# Patient Record
Sex: Male | Born: 1975 | Hispanic: Yes | Marital: Single | Smoking: Never smoker
Health system: Southern US, Community
[De-identification: ages and names within clinical notes are randomized; demographics above are authoritative.]

## PROBLEM LIST (undated history)

## (undated) DIAGNOSIS — Z789 Other specified health status: Secondary | ICD-10-CM

## (undated) DIAGNOSIS — I639 Cerebral infarction, unspecified: Secondary | ICD-10-CM

## (undated) HISTORY — PX: NO PAST SURGERIES: SHX2092

---

## 2015-01-31 ENCOUNTER — Emergency Department: Payer: Self-pay | Admitting: Emergency Medicine

## 2015-09-02 ENCOUNTER — Emergency Department: Payer: Self-pay

## 2015-09-02 ENCOUNTER — Inpatient Hospital Stay
Admission: EM | Admit: 2015-09-02 | Discharge: 2015-09-04 | DRG: 064 | Disposition: A | Discharge status: Home / Self Care | Payer: Self-pay | Attending: Internal Medicine | Admitting: Internal Medicine

## 2015-09-02 ENCOUNTER — Encounter: Payer: Self-pay | Admitting: Urgent Care

## 2015-09-02 DIAGNOSIS — I639 Cerebral infarction, unspecified: Principal | ICD-10-CM | POA: Diagnosis present

## 2015-09-02 DIAGNOSIS — K219 Gastro-esophageal reflux disease without esophagitis: Secondary | ICD-10-CM | POA: Diagnosis present

## 2015-09-02 DIAGNOSIS — Z79899 Other long term (current) drug therapy: Secondary | ICD-10-CM

## 2015-09-02 DIAGNOSIS — Z7982 Long term (current) use of aspirin: Secondary | ICD-10-CM

## 2015-09-02 DIAGNOSIS — B690 Cysticercosis of central nervous system: Secondary | ICD-10-CM | POA: Diagnosis present

## 2015-09-02 DIAGNOSIS — F410 Panic disorder [episodic paroxysmal anxiety] without agoraphobia: Secondary | ICD-10-CM | POA: Diagnosis present

## 2015-09-02 DIAGNOSIS — F329 Major depressive disorder, single episode, unspecified: Secondary | ICD-10-CM | POA: Diagnosis present

## 2015-09-02 DIAGNOSIS — I7774 Dissection of vertebral artery: Secondary | ICD-10-CM | POA: Diagnosis present

## 2015-09-02 DIAGNOSIS — F411 Generalized anxiety disorder: Secondary | ICD-10-CM | POA: Diagnosis present

## 2015-09-02 DIAGNOSIS — G4700 Insomnia, unspecified: Secondary | ICD-10-CM | POA: Diagnosis present

## 2015-09-02 DIAGNOSIS — E785 Hyperlipidemia, unspecified: Secondary | ICD-10-CM | POA: Diagnosis present

## 2015-09-02 HISTORY — DX: Other specified health status: Z78.9

## 2015-09-02 LAB — CBC
HEMATOCRIT: 45.9 % (ref 40.0–52.0)
Hemoglobin: 16 g/dL (ref 13.0–18.0)
MCH: 29 pg (ref 26.0–34.0)
MCHC: 34.7 g/dL (ref 32.0–36.0)
MCV: 83.6 fL (ref 80.0–100.0)
PLATELETS: 218 10*3/uL (ref 150–440)
RBC: 5.49 MIL/uL (ref 4.40–5.90)
RDW: 13.3 % (ref 11.5–14.5)
WBC: 10 10*3/uL (ref 3.8–10.6)

## 2015-09-02 NOTE — ED Notes (Signed)
Patient presents with c/o a non-specific headache for over 6 months; worse over the last 2 weeks. Patient advising that he "feels like something is moving in his head." Patient with increased vertiginous symptoms. Patient reports that is is confused at night when he wakes up - "I wake up scared because I do not know when I am." Patient CAO x 4 at time of triage.

## 2015-09-03 ENCOUNTER — Encounter: Payer: Self-pay | Admitting: Internal Medicine

## 2015-09-03 ENCOUNTER — Inpatient Hospital Stay: Payer: Self-pay

## 2015-09-03 ENCOUNTER — Inpatient Hospital Stay
Admit: 2015-09-03 | Discharge: 2015-09-03 | Disposition: A | Discharge status: Home / Self Care | Payer: Self-pay | Attending: Internal Medicine | Admitting: Internal Medicine

## 2015-09-03 DIAGNOSIS — F410 Panic disorder [episodic paroxysmal anxiety] without agoraphobia: Secondary | ICD-10-CM

## 2015-09-03 DIAGNOSIS — I639 Cerebral infarction, unspecified: Secondary | ICD-10-CM | POA: Diagnosis present

## 2015-09-03 DIAGNOSIS — B690 Cysticercosis of central nervous system: Secondary | ICD-10-CM | POA: Diagnosis present

## 2015-09-03 DIAGNOSIS — I63211 Cerebral infarction due to unspecified occlusion or stenosis of right vertebral arteries: Secondary | ICD-10-CM

## 2015-09-03 LAB — CBC WITH DIFFERENTIAL/PLATELET
Basophils Absolute: 0 10*3/uL (ref 0–0.1)
Basophils Relative: 0 %
Eosinophils Absolute: 0.2 10*3/uL (ref 0–0.7)
Eosinophils Relative: 2 %
HCT: 44.1 % (ref 40.0–52.0)
Hemoglobin: 15.7 g/dL (ref 13.0–18.0)
Lymphocytes Relative: 36 %
Lymphs Abs: 3.2 10*3/uL (ref 1.0–3.6)
MCH: 29.8 pg (ref 26.0–34.0)
MCHC: 35.6 g/dL (ref 32.0–36.0)
MCV: 83.7 fL (ref 80.0–100.0)
Monocytes Absolute: 0.7 10*3/uL (ref 0.2–1.0)
Monocytes Relative: 8 %
Neutro Abs: 4.8 10*3/uL (ref 1.4–6.5)
Neutrophils Relative %: 54 %
Platelets: 190 10*3/uL (ref 150–440)
RBC: 5.26 MIL/uL (ref 4.40–5.90)
RDW: 13.6 % (ref 11.5–14.5)
WBC: 8.9 10*3/uL (ref 3.8–10.6)

## 2015-09-03 LAB — URINALYSIS COMPLETE WITH MICROSCOPIC (ARMC ONLY)
Bacteria, UA: NONE SEEN
Bilirubin Urine: NEGATIVE
Glucose, UA: NEGATIVE mg/dL
HGB URINE DIPSTICK: NEGATIVE
KETONES UR: NEGATIVE mg/dL
Leukocytes, UA: NEGATIVE
NITRITE: NEGATIVE
PH: 7 (ref 5.0–8.0)
PROTEIN: NEGATIVE mg/dL
SPECIFIC GRAVITY, URINE: 1.024 (ref 1.005–1.030)
Squamous Epithelial / LPF: NONE SEEN

## 2015-09-03 LAB — COMPREHENSIVE METABOLIC PANEL
ALT: 31 U/L (ref 17–63)
AST: 25 U/L (ref 15–41)
Albumin: 4 g/dL (ref 3.5–5.0)
Alkaline Phosphatase: 41 U/L (ref 38–126)
Anion gap: 6 (ref 5–15)
BUN: 13 mg/dL (ref 6–20)
CO2: 27 mmol/L (ref 22–32)
Calcium: 8.8 mg/dL — ABNORMAL LOW (ref 8.9–10.3)
Chloride: 103 mmol/L (ref 101–111)
Creatinine, Ser: 0.83 mg/dL (ref 0.61–1.24)
GFR calc Af Amer: >60 mL/min (ref >60)
GFR calc non Af Amer: >60 mL/min (ref >60)
Glucose, Bld: 105 mg/dL — ABNORMAL HIGH (ref 65–99)
Potassium: 3.4 mmol/L — ABNORMAL LOW (ref 3.5–5.1)
Sodium: 136 mmol/L (ref 135–145)
Total Bilirubin: 0.5 mg/dL (ref 0.3–1.2)
Total Protein: 7 g/dL (ref 6.5–8.1)

## 2015-09-03 LAB — BASIC METABOLIC PANEL
Anion gap: 6 (ref 5–15)
BUN: 12 mg/dL (ref 6–20)
CHLORIDE: 103 mmol/L (ref 101–111)
CO2: 29 mmol/L (ref 22–32)
Calcium: 9.1 mg/dL (ref 8.9–10.3)
Creatinine, Ser: 0.88 mg/dL (ref 0.61–1.24)
GFR calc non Af Amer: >60 mL/min (ref >60)
Glucose, Bld: 97 mg/dL (ref 65–99)
POTASSIUM: 3.4 mmol/L — AB (ref 3.5–5.1)
SODIUM: 138 mmol/L (ref 135–145)

## 2015-09-03 LAB — HEMOGLOBIN A1C: Hgb A1c MFr Bld: 5.5 % (ref 4.0–6.0)

## 2015-09-03 LAB — LIPID PANEL
Cholesterol: 195 mg/dL (ref 0–200)
HDL: 23 mg/dL — ABNORMAL LOW (ref >40)
LDL Cholesterol: 117 mg/dL — ABNORMAL HIGH (ref 0–99)
Total CHOL/HDL Ratio: 8.5 RATIO
Triglycerides: 277 mg/dL — ABNORMAL HIGH (ref <150)
VLDL: 55 mg/dL — ABNORMAL HIGH (ref 0–40)

## 2015-09-03 LAB — TROPONIN I: Troponin I: <0.03 ng/mL (ref <0.031)

## 2015-09-03 MED ORDER — ACETAMINOPHEN 650 MG RE SUPP: 650.0000 mg | Freq: Four times a day (QID) | RECTAL | Status: DC | PRN

## 2015-09-03 MED ORDER — ENOXAPARIN SODIUM 40 MG/0.4ML ~~LOC~~ SOLN
40.0000 mg | Freq: Every day | SUBCUTANEOUS | Status: DC
  Administered 2015-09-03 – 2015-09-04 (×2): 40 mg via SUBCUTANEOUS
  Filled 2015-09-03 (×2): qty 0.4

## 2015-09-03 MED ORDER — GADOBENATE DIMEGLUMINE 529 MG/ML IV SOLN
15.0000 mL | Freq: Once | INTRAVENOUS | Status: AC | PRN
  Administered 2015-09-03: 12:00:00 15 mL via INTRAVENOUS

## 2015-09-03 MED ORDER — DEXAMETHASONE 4 MG PO TABS
4.0000 mg | ORAL_TABLET | Freq: Two times a day (BID) | ORAL | Status: DC
Start: 2015-09-03 — End: 2015-09-04
  Administered 2015-09-03 – 2015-09-04 (×3): 4 mg via ORAL
  Filled 2015-09-03 (×3): qty 1

## 2015-09-03 MED ORDER — PANTOPRAZOLE SODIUM 40 MG PO TBEC
40.0000 mg | DELAYED_RELEASE_TABLET | Freq: Every day | ORAL | Status: DC
  Administered 2015-09-03 – 2015-09-04 (×2): 40 mg via ORAL
  Filled 2015-09-03 (×2): qty 1

## 2015-09-03 MED ORDER — STROKE: EARLY STAGES OF RECOVERY BOOK
Freq: Once | Status: AC
  Administered 2015-09-03: 03:00:00

## 2015-09-03 MED ORDER — ONDANSETRON HCL 4 MG/2ML IJ SOLN: 4.0000 mg | Freq: Four times a day (QID) | INTRAMUSCULAR | Status: DC | PRN

## 2015-09-03 MED ORDER — ONDANSETRON HCL 4 MG PO TABS: 4.0000 mg | ORAL_TABLET | Freq: Four times a day (QID) | ORAL | Status: DC | PRN

## 2015-09-03 MED ORDER — ACETAMINOPHEN 325 MG PO TABS: 650.0000 mg | ORAL_TABLET | Freq: Four times a day (QID) | ORAL | Status: DC | PRN

## 2015-09-03 MED ORDER — ASPIRIN EC 325 MG PO TBEC
325.0000 mg | DELAYED_RELEASE_TABLET | Freq: Every day | ORAL | Status: DC
  Administered 2015-09-03 – 2015-09-04 (×2): 325 mg via ORAL
  Filled 2015-09-03 (×2): qty 1

## 2015-09-03 MED ORDER — LEVETIRACETAM 500 MG PO TABS
500.0000 mg | ORAL_TABLET | Freq: Two times a day (BID) | ORAL | Status: DC
  Administered 2015-09-03 – 2015-09-04 (×4): 500 mg via ORAL
  Filled 2015-09-03 (×4): qty 1

## 2015-09-03 MED ORDER — ATORVASTATIN CALCIUM 20 MG PO TABS
40.0000 mg | ORAL_TABLET | Freq: Every day | ORAL | Status: DC
  Administered 2015-09-03: 18:00:00 40 mg via ORAL
  Filled 2015-09-03: qty 2

## 2015-09-03 MED ORDER — DOCUSATE SODIUM 100 MG PO CAPS
100.0000 mg | ORAL_CAPSULE | Freq: Two times a day (BID) | ORAL | Status: DC
  Administered 2015-09-03 – 2015-09-04 (×3): 100 mg via ORAL
  Filled 2015-09-03 (×3): qty 1

## 2015-09-03 MED ORDER — SODIUM CHLORIDE 0.9 % IJ SOLN
3.0000 mL | Freq: Two times a day (BID) | INTRAMUSCULAR | Status: DC
  Administered 2015-09-03 – 2015-09-04 (×4): 3 mL via INTRAVENOUS

## 2015-09-03 MED ORDER — ALBENDAZOLE 200 MG PO TABS
400.0000 mg | ORAL_TABLET | Freq: Two times a day (BID) | ORAL | Status: DC
  Administered 2015-09-03 – 2015-09-04 (×3): 400 mg via ORAL
  Filled 2015-09-03 (×5): qty 2

## 2015-09-03 MED ORDER — MIRTAZAPINE 15 MG PO TABS
15.0000 mg | ORAL_TABLET | Freq: Every day | ORAL | Status: DC
  Administered 2015-09-03: 15 mg via ORAL
  Filled 2015-09-03: qty 1

## 2015-09-03 MED ORDER — HYDROCODONE-ACETAMINOPHEN 5-325 MG PO TABS
1.0000 | ORAL_TABLET | ORAL | Status: DC | PRN
Start: 2015-09-03 — End: 2015-09-04

## 2015-09-03 NOTE — Progress Notes (Signed)
Notified Dr. 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

## 2015-09-03 NOTE — Consult Note (Signed)
CC: headache  HPI: Bill Brown is an 39 y.o. male who presents with complaint of persistent dizziness with some vague intermittent mental status changes. Patient states his headache and minor symptoms having going on for some time, but have gotten acutely worse the last 2 weeks. 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   Pt is from [place], came to [place] about 20 yrs ago. Pt has possibility of vertebral dissection.    Past Medical History  Diagnosis Date  . Patient denies medical problems     Past Surgical History  Procedure Laterality Date  . No past surgeries      Family History  Problem Relation Age of Onset  . Family history unknown: Yes    Social History:  reports that he has never smoked. He does not have any smokeless tobacco history on file. He reports that he does not drink alcohol or use illicit drugs.  No Known Allergies  Medications: I have reviewed the patient's current medications.  ROS: History obtained from the patient  General ROS: negative for - chills, fatigue, fever, night sweats, weight gain or weight loss Psychological ROS: negative for - anxiety and depression  Ophthalmic ROS: negative for - blurry vision, double vision, eye pain or loss of vision ENT ROS: negative for - epistaxis, nasal discharge, oral lesions, sore throat, tinnitus or vertigo Allergy and Immunology ROS: negative for - hives or itchy/watery eyes Hematological and Lymphatic ROS: negative for - bleeding problems, bruising or swollen lymph nodes Endocrine ROS: negative for - galactorrhea, hair pattern changes, polydipsia/polyuria or temperature intolerance Respiratory ROS: negative for - cough, hemoptysis, shortness of breath or wheezing Cardiovascular ROS: negative for - chest pain, dyspnea on exertion, edema or irregular heartbeat Gastrointestinal ROS: negative for - abdominal pain, diarrhea, hematemesis, nausea/vomiting or stool  incontinence Genito-Urinary ROS: negative for - dysuria, hematuria, incontinence or urinary frequency/urgency Musculoskeletal ROS: negative for - joint swelling or muscular weakness Neurological ROS: as noted in HPI Dermatological ROS: negative for rash and skin lesion changes  Physical Examination: Blood pressure 111/70, pulse 48, temperature 97.8 F (36.6 C), temperature source Oral, resp. rate 18, height 5\' 3"  (1.6 m), weight 72.576 kg (160 lb), SpO2 99 %.   Neurological Examination Mental Status: Alert, oriented, thought content appropriate.  Speech fluent without evidence of aphasia.  Able to follow 3 step commands without difficulty. Cranial Nerves: II: Discs flat bilaterally; Visual fields grossly normal, pupils equal, round, reactive to light and accommodation III,IV, VI: ptosis not present, extra-ocular motions intact bilaterally V,VII: smile symmetric, facial light touch sensation normal bilaterally VIII: hearing normal bilaterally IX,X: gag reflex present XI: bilateral shoulder shrug XII: midline tongue extension Motor: Right : Upper extremity   5/5    Left:     Upper extremity   5/5  Lower extremity   5/5     Lower extremity   5/5 Tone and bulk:normal tone throughout; no atrophy noted Sensory: Pinprick and light touch intact throughout, bilaterally Deep Tendon Reflexes: 2+ and symmetric throughout Plantars: Right: downgoing   Left: downgoing Cerebellar: normal finger-to-nose, normal rapid alternating movements and normal heel-to-shin test Gait: normal gait and station      Laboratory Studies:   Basic Metabolic Panel:  Recent Labs Lab 09/02/15 2226 09/03/15 0611  NA 138 136  K 3.4* 3.4*  CL 103 103  CO2 29 27  GLUCOSE 97 105*  BUN 12 13  CREATININE 0.88 0.83  CALCIUM 9.1 8.8*  Liver Function Tests:  Recent Labs Lab 09/03/15 0611  AST 25  ALT 31  ALKPHOS 41  BILITOT 0.5  PROT 7.0  ALBUMIN 4.0   No results for input(s): LIPASE, AMYLASE in  the last 168 hours. No results for input(s): AMMONIA in the last 168 hours.  CBC:  Recent Labs Lab 09/02/15 2226 09/03/15 0611  WBC 10.0 8.9  NEUTROABS  --  4.8  HGB 16.0 15.7  HCT 45.9 44.1  MCV 83.6 83.7  PLT 218 190    Cardiac Enzymes:  Recent Labs Lab 09/02/15 2226  TROPONINI <0.03    BNP: Invalid input(s): POCBNP  CBG: No results for input(s): GLUCAP in the last 168 hours.  Microbiology: No results found for this or any previous visit.  Coagulation Studies: No results for input(s): LABPROT, INR in the last 72 hours.  Urinalysis:  Recent Labs Lab 09/03/15 0004  COLORURINE YELLOW*  LABSPEC 1.024  PHURINE 7.0  GLUCOSEU NEGATIVE  HGBUR NEGATIVE  BILIRUBINUR NEGATIVE  KETONESUR NEGATIVE  PROTEINUR NEGATIVE  NITRITE NEGATIVE  LEUKOCYTESUR NEGATIVE    Lipid Panel:     Component Value Date/Time   CHOL 195 09/03/2015 0611   TRIG 277* 09/03/2015 0611   HDL 23* 09/03/2015 0611   CHOLHDL 8.5 09/03/2015 0611   VLDL 55* 09/03/2015 0611   LDLCALC 117* 09/03/2015 0611    HgbA1C: No results found for: HGBA1C  Urine Drug Screen:  No results found for: LABOPIA, COCAINSCRNUR, LABBENZ, AMPHETMU, THCU, LABBARB  Alcohol Level: No results for input(s): ETH in the last 168 hours.  Other results: EKG: normal EKG, normal sinus rhythm, unchanged from previous tracings.  Imaging: Ct Head Wo Contrast  09/02/2015  CLINICAL DATA:  39 year old male acute dizziness and altered mental status. Symptoms for 6 months but greater in the last 2 weeks. Initial encounter. EXAM: CT HEAD WITHOUT CONTRAST TECHNIQUE: Contiguous axial images were obtained from the base of the skull through the vertex without intravenous contrast. COMPARISON:  None. FINDINGS: Visualized paranasal sinuses and mastoids are clear. Negative visualized tympanic cavities. Visualized orbit soft tissues are within normal limits. Visualized scalp soft tissues are within normal limits. No acute osseous  abnormality identified. Cerebral volume is within normal limits. There are several round 4-5 mm calcified foci scattered in the brain. No associated edema or mass effect. These appear primarily subarachnoid in location. That at the cisterna magna on series 2, image 5 might be related to the distal vertebral artery, uncertain. There is confluent peripheral hypodensity in the right cerebellum most pronounced on series 2, image 12. No associated hemorrhage or posterior fossa mass effect. Other posterior fossa gray-white matter differentiation is within normal limits. Supratentorial gray-white matter differentiation is normal. No acute intracranial hemorrhage identified. No ventriculomegaly. No other acute cortically based infarct identified. IMPRESSION: 1. 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 No hemorrhage or mass effect. 2. Largely unremarkable noncontrast CT appearance of the brain otherwise; several 4 mm calcified foci are nonspecific but might reflect neurocysticercosis if the patient has a history of travel to or from an endemic area. Electronically Signed   By: Odessa Fleming M.D.   On: 09/02/2015 22:36   Mr Angiogram Neck W Wo Contrast  09/03/2015  CLINICAL DATA:  39 year old male with acute dizziness and altered mental status. Symptoms for 6 months but worse over the past 2 weeks. Abnormal CT. Subsequent encounter. EXAM: MRI HEAD WITHOUT AND WITH CONTRAST AND MRA HEAD WITHOUT CONTRAST AND MRI NECK WITHOUT AND WITH CONTRAST TECHNIQUE: Multiplanar, multiecho  pulse sequences of the brain and surrounding structures were obtained without and with intravenous contrast. Angiographic images of the head were obtained using MRA technique without contrast. Multiplanar, multiecho pulse sequences of the neck and surrounding structures were obtained without and with intravenous contrast. CONTRAST:  15mL MULTIHANCE GADOBENATE DIMEGLUMINE 529 MG/ML IV SOLN COMPARISON:  09/02/2015 head CT.  No comparison brain  MR. FINDINGS: MRI HEAD FINDINGS No acute infarct. Remote posterior inferior cerebellar infarct with encephalomalacia. No intracranial hemorrhage. Calcifications anterior medial right frontal lobe, posterior superior right parietal lobe and right medulla may reflect result of prior infection (neurocysticercosis) without surrounding vasogenic edema to suggest active inflammation. No intracranial enhancing lesion. No hydrocephalus. 2 cm polypoid opacification inferior left maxillary sinus may represent retention cyst. Mild mucosal thickening maxillary sinuses and ethmoid sinus air cells. Small pituitary gland. Cervical medullary junction, orbital structures and pineal region unremarkable. MRA HEAD FINDINGS Small irregular right vertebral artery ends in a posterior inferior cerebellar artery distribution. The full extent of the right posterior inferior cerebellar artery is not visualized. The portion which is visualized appears slightly narrowed and irregular. Large slightly ectatic left vertebral artery with minimal regularity distal aspect. Mild irregularity of the left posterior inferior cerebellar artery. Ectatic basilar artery with mild narrowing mid aspect without high-grade stenosis. Nonvisualized left anterior inferior cerebellar artery with small appearing right anterior inferior cerebellar artery. Distal aspect of the left superior cerebellar artery is not visualized. Slight irregularity of the right superior cerebellar artery. Slight irregularity distal branches posterior cerebral arteries more notable on the left. Anterior circulation without medium or large size vessel significant stenosis or occlusion. Mild narrowing at the junction of the A1/ A2 segment of the right anterior cerebral artery. Middle cerebral artery branch vessel narrowing and irregularity bilaterally. Minimal bulge undersurface M1 segment right middle cerebral artery may represent origin of a vessel rather than aneurysm. MRI NECK FINDINGS  Three vessel aortic arch. No significant stenosis of either carotid artery. Ectatic distal vertical segment of the right internal carotid artery. No significant stenosis of either subclavian artery or the left vertebral artery. Small irregular right vertebral artery with multiple areas of focal loss of signal. It may be that the right vertebral artery is congenitally small and evaluation limited by the technique however, given the patient's history, right vertebral right dissection is a possibility. Atherosclerotic type changes superimposed upon congenitally small right vertebral artery is a secondary consideration. IMPRESSION: No acute infarct. Remote posterior inferior cerebellar infarct with encephalomalacia. Small irregular right vertebral artery with multiple areas of focal loss of signal. It may be that the right vertebral artery is congenitally small and evaluation limited by the technique however, given the patient's history (right cerebellar infarct), right vertebral right dissection is a distinct possibility. Atherosclerotic type changes superimposed upon congenitally small right vertebral artery is a secondary less likely consideration. Right vertebral artery ends in a posterior inferior cerebellar artery distribution. Intracranial branch vessel irregularity as noted above. Calcifications anterior medial right frontal lobe, posterior superior right parietal lobe and right medulla may reflect result of prior infection (neurocysticercosis) without surrounding vasogenic edema to suggest active inflammation. 2 cm polypoid opacification inferior left maxillary sinus may represent retention cyst. Mild mucosal thickening maxillary sinuses and ethmoid sinus air cells. These results will be called to the ordering clinician or representative by the Radiologist Assistant, and communication documented in the PACS or zVision Dashboard. Electronically Signed   By: Lacy Duverney M.D.   On: 09/03/2015 12:12   Mr Laqueta Jean  Wo Contrast  09/03/2015  CLINICAL DATA:  39 year old male with acute dizziness and altered mental status. Symptoms for 6 months but worse over the past 2 weeks. Abnormal CT. Subsequent encounter. EXAM: MRI HEAD WITHOUT AND WITH CONTRAST AND MRA HEAD WITHOUT CONTRAST AND MRI NECK WITHOUT AND WITH CONTRAST TECHNIQUE: Multiplanar, multiecho pulse sequences of the brain and surrounding structures were obtained without and with intravenous contrast. Angiographic images of the head were obtained using MRA technique without contrast. Multiplanar, multiecho pulse sequences of the neck and surrounding structures were obtained without and with intravenous contrast. CONTRAST:  15mL MULTIHANCE GADOBENATE DIMEGLUMINE 529 MG/ML IV SOLN COMPARISON:  09/02/2015 head CT.  No comparison brain MR. FINDINGS: MRI HEAD FINDINGS No acute infarct. Remote posterior inferior cerebellar infarct with encephalomalacia. No intracranial hemorrhage. Calcifications anterior medial right frontal lobe, posterior superior right parietal lobe and right medulla may reflect result of prior infection (neurocysticercosis) without surrounding vasogenic edema to suggest active inflammation. No intracranial enhancing lesion. No hydrocephalus. 2 cm polypoid opacification inferior left maxillary sinus may represent retention cyst. Mild mucosal thickening maxillary sinuses and ethmoid sinus air cells. Small pituitary gland. Cervical medullary junction, orbital structures and pineal region unremarkable. MRA HEAD FINDINGS Small irregular right vertebral artery ends in a posterior inferior cerebellar artery distribution. The full extent of the right posterior inferior cerebellar artery is not visualized. The portion which is visualized appears slightly narrowed and irregular. Large slightly ectatic left vertebral artery with minimal regularity distal aspect. Mild irregularity of the left posterior inferior cerebellar artery. Ectatic basilar artery with mild  narrowing mid aspect without high-grade stenosis. Nonvisualized left anterior inferior cerebellar artery with small appearing right anterior inferior cerebellar artery. Distal aspect of the left superior cerebellar artery is not visualized. Slight irregularity of the right superior cerebellar artery. Slight irregularity distal branches posterior cerebral arteries more notable on the left. Anterior circulation without medium or large size vessel significant stenosis or occlusion. Mild narrowing at the junction of the A1/ A2 segment of the right anterior cerebral artery. Middle cerebral artery branch vessel narrowing and irregularity bilaterally. Minimal bulge undersurface M1 segment right middle cerebral artery may represent origin of a vessel rather than aneurysm. MRI NECK FINDINGS Three vessel aortic arch. No significant stenosis of either carotid artery. Ectatic distal vertical segment of the right internal carotid artery. No significant stenosis of either subclavian artery or the left vertebral artery. Small irregular right vertebral artery with multiple areas of focal loss of signal. It may be that the right vertebral artery is congenitally small and evaluation limited by the technique however, given the patient's history, right vertebral right dissection is a possibility. Atherosclerotic type changes superimposed upon congenitally small right vertebral artery is a secondary consideration. IMPRESSION: No acute infarct. Remote posterior inferior cerebellar infarct with encephalomalacia. Small irregular right vertebral artery with multiple areas of focal loss of signal. It may be that the right vertebral artery is congenitally small and evaluation limited by the technique however, given the patient's history (right cerebellar infarct), right vertebral right dissection is a distinct possibility. Atherosclerotic type changes superimposed upon congenitally small right vertebral artery is a secondary less likely  consideration. Right vertebral artery ends in a posterior inferior cerebellar artery distribution. Intracranial branch vessel irregularity as noted above. Calcifications anterior medial right frontal lobe, posterior superior right parietal lobe and right medulla may reflect result of prior infection (neurocysticercosis) without surrounding vasogenic edema to suggest active inflammation. 2 cm polypoid opacification inferior left maxillary sinus may represent retention cyst.  Mild mucosal thickening maxillary sinuses and ethmoid sinus air cells. These results will be called to the ordering clinician or representative by the Radiologist Assistant, and communication documented in the PACS or zVision Dashboard. Electronically Signed   By: Lacy Duverney M.D.   On: 09/03/2015 12:12   Mr Palma Holter  09/03/2015  CLINICAL DATA:  39 year old male with acute dizziness and altered mental status. Symptoms for 6 months but worse over the past 2 weeks. Abnormal CT. Subsequent encounter. EXAM: MRI HEAD WITHOUT AND WITH CONTRAST AND MRA HEAD WITHOUT CONTRAST AND MRI NECK WITHOUT AND WITH CONTRAST TECHNIQUE: Multiplanar, multiecho pulse sequences of the brain and surrounding structures were obtained without and with intravenous contrast. Angiographic images of the head were obtained using MRA technique without contrast. Multiplanar, multiecho pulse sequences of the neck and surrounding structures were obtained without and with intravenous contrast. CONTRAST:  15mL MULTIHANCE GADOBENATE DIMEGLUMINE 529 MG/ML IV SOLN COMPARISON:  09/02/2015 head CT.  No comparison brain MR. FINDINGS: MRI HEAD FINDINGS No acute infarct. Remote posterior inferior cerebellar infarct with encephalomalacia. No intracranial hemorrhage. Calcifications anterior medial right frontal lobe, posterior superior right parietal lobe and right medulla may reflect result of prior infection (neurocysticercosis) without surrounding vasogenic edema to suggest active  inflammation. No intracranial enhancing lesion. No hydrocephalus. 2 cm polypoid opacification inferior left maxillary sinus may represent retention cyst. Mild mucosal thickening maxillary sinuses and ethmoid sinus air cells. Small pituitary gland. Cervical medullary junction, orbital structures and pineal region unremarkable. MRA HEAD FINDINGS Small irregular right vertebral artery ends in a posterior inferior cerebellar artery distribution. The full extent of the right posterior inferior cerebellar artery is not visualized. The portion which is visualized appears slightly narrowed and irregular. Large slightly ectatic left vertebral artery with minimal regularity distal aspect. Mild irregularity of the left posterior inferior cerebellar artery. Ectatic basilar artery with mild narrowing mid aspect without high-grade stenosis. Nonvisualized left anterior inferior cerebellar artery with small appearing right anterior inferior cerebellar artery. Distal aspect of the left superior cerebellar artery is not visualized. Slight irregularity of the right superior cerebellar artery. Slight irregularity distal branches posterior cerebral arteries more notable on the left. Anterior circulation without medium or large size vessel significant stenosis or occlusion. Mild narrowing at the junction of the A1/ A2 segment of the right anterior cerebral artery. Middle cerebral artery branch vessel narrowing and irregularity bilaterally. Minimal bulge undersurface M1 segment right middle cerebral artery may represent origin of a vessel rather than aneurysm. MRI NECK FINDINGS Three vessel aortic arch. No significant stenosis of either carotid artery. Ectatic distal vertical segment of the right internal carotid artery. No significant stenosis of either subclavian artery or the left vertebral artery. Small irregular right vertebral artery with multiple areas of focal loss of signal. It may be that the right vertebral artery is  congenitally small and evaluation limited by the technique however, given the patient's history, right vertebral right dissection is a possibility. Atherosclerotic type changes superimposed upon congenitally small right vertebral artery is a secondary consideration. IMPRESSION: No acute infarct. Remote posterior inferior cerebellar infarct with encephalomalacia. Small irregular right vertebral artery with multiple areas of focal loss of signal. It may be that the right vertebral artery is congenitally small and evaluation limited by the technique however, given the patient's history (right cerebellar infarct), right vertebral right dissection is a distinct possibility. Atherosclerotic type changes superimposed upon congenitally small right vertebral artery is a secondary less likely consideration. Right vertebral artery ends in a posterior inferior  cerebellar artery distribution. Intracranial branch vessel irregularity as noted above. Calcifications anterior medial right frontal lobe, posterior superior right parietal lobe and right medulla may reflect result of prior infection (neurocysticercosis) without surrounding vasogenic edema to suggest active inflammation. 2 cm polypoid opacification inferior left maxillary sinus may represent retention cyst. Mild mucosal thickening maxillary sinuses and ethmoid sinus air cells. These results will be called to the ordering clinician or representative by the Radiologist Assistant, and communication documented in the PACS or zVision Dashboard. Electronically Signed   By: Lacy Duverney M.D.   On: 09/03/2015 12:12     Assessment/Plan:  39 y.o. male who presents with complaint of persistent dizziness with some vague intermittent mental status changes. Patient states his headache and minor symptoms having going on for some time, but have gotten acutely worse the last 2 weeks. 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   Pt is  from [place], came to [place] about 20 yrs ago. Pt has possibility of vertebral dissection.    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 No need for steroids or anti epileptics as calcifications are not cortical  2. R cerebellar subacute stroke  - Likely in the setting of vertebral dissection. No need for anticoagulation. Full dose of ASA daily 3. Depression/anxieoty  - Psychiatric evaluation which can be done as out pt, but not sure about pt's insurance 4. D/c planning later today/tomorrow.  Pauletta Browns  09/03/2015, 1:09 PM

## 2015-09-03 NOTE — Progress Notes (Signed)
Speech Therapy Note: received order, reviewed chart notes and consulted NSG. Met w/ pt who denied any s/s of aspiration/dysphagia; stated he ate some of his breakfast meal w/out difficulty. NSG denied any s/s of aspiration w/ meds. Pt is on a regular diet. Pt conversed w/ SLP at conversational level w/out any s/s receptive or expressive language deficits; speech was 100% intelligible. Pt does speak [place] and [place].  Of note, he shared w/ me concerns of not sleeping well, s/s of Reflux, as well as Anxiety which he stated he had been "reading on" at home. He described that his "thoughts" keep him from sleeping well and that he awakens "20" times some nights. He described that this has been ongoing since he was ~39 yrs old. He also stated he feels he is under stress at home. Discussed pt's report of his situation w/ his MD who will f/u.  No further skilled ST services indicated at this time. NSG/MD to reconsult if status changes.

## 2015-09-03 NOTE — ED Notes (Signed)
MD at bedside. 

## 2015-09-03 NOTE — Plan of Care (Signed)
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 Uneventful shift.

## 2015-09-03 NOTE — H&P (Addendum)
[facility] - [place] at [facility]   PATIENT NAME: Bill Brown    MR#:  [id number]  DATE OF BIRTH:  August 04, 1976  DATE OF ADMISSION:  09/02/2015  PRIMARY CARE PHYSICIAN: No PCP Per Patient   REQUESTING/REFERRING PHYSICIAN: Manson Passey, M.D.  CHIEF COMPLAINT:   Chief Complaint  Patient presents with  . Headache  . Dizziness  . Altered Mental Status    HISTORY OF PRESENT ILLNESS:  Bill Brown  is a 39 y.o. male who presents with complaint of persistent dizziness with some vague intermittent mental status changes. Patient states his headache and minor symptoms having going on for some time, but have gotten acutely worse the last 2 weeks. Patient states that he has had a lot of stress recently, with tense neck muscles for which she had been stretching his neck, and manipulating his cervical spine. He states that his C-spine would "pop a lot". He feels that he has "something moving down in his head." He came to the ED for evaluation. 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 On further interview the patient does state that he is seeing something moving across his vision, like a "squiggly line". 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  PAST MEDICAL HISTORY:   Past Medical History  Diagnosis Date  . Patient denies medical problems     PAST SURGICAL HISTORY:   Past Surgical History  Procedure Laterality Date  . No past surgeries      SOCIAL HISTORY:   Social History  Substance Use Topics  . Smoking status: Never Smoker   . Smokeless tobacco: Not on file  . Alcohol Use: No    FAMILY HISTORY:   Family History  Problem Relation Age of Onset  . Family history unknown: Yes    DRUG ALLERGIES:  No Known Allergies  MEDICATIONS AT HOME:   Prior to Admission medications   Not on File     REVIEW OF SYSTEMS:  Review of Systems  Constitutional: Negative for fever, chills, weight loss and malaise/fatigue.  HENT: Negative for ear pain, hearing loss and tinnitus.   Eyes: Positive for blurred vision. Negative for double vision, pain and redness.  Respiratory: Negative for cough, hemoptysis and shortness of breath.   Cardiovascular: Negative for chest pain, palpitations, orthopnea and leg swelling.  Gastrointestinal: Negative for nausea, vomiting, abdominal pain, diarrhea and constipation.  Genitourinary: Negative for dysuria, frequency and hematuria.  Musculoskeletal: Negative for back pain, joint pain and neck pain.  Skin:       No acne, rash, or lesions  Neurological: Positive for dizziness. Negative for tremors, focal weakness and weakness.       Intermittent confusion  Endo/Heme/Allergies: Negative for polydipsia. Does not bruise/bleed easily.  Psychiatric/Behavioral: Negative for depression. The patient is not nervous/anxious and does not have insomnia.      VITAL SIGNS:   Filed Vitals:   09/03/15 0015 09/03/15 0030 09/03/15 0045 09/03/15 0100  BP:   120/79 138/103  Pulse: 56 59 64 55  Temp:      TempSrc:      Resp:      Height:      Weight:      SpO2: 100% 97% 100% 100%   Wt Readings from Last 3 Encounters:  09/02/15 67.132 kg (148 lb)    PHYSICAL EXAMINATION:  Physical Exam  Vitals reviewed. Constitutional:  He is oriented to person, place, and time. He appears well-developed and well-nourished. No distress.  HENT:  Head: Normocephalic and atraumatic.  Mouth/Throat: Oropharynx is clear and moist.  Eyes: Conjunctivae and EOM are normal. Pupils are equal, round, and reactive to light. No scleral icterus.  Neck: Normal range of motion. Neck supple. No JVD present. No thyromegaly present.  Cardiovascular: Normal rate, regular rhythm and intact distal pulses.  Exam reveals no gallop and no friction rub.   No murmur heard. Respiratory: Effort normal and  breath sounds normal. No respiratory distress. He has no wheezes. He has no rales.  GI: Soft. Bowel sounds are normal. He exhibits no distension. There is no tenderness.  Musculoskeletal: Normal range of motion. He exhibits no edema.  No arthritis, no gout  Lymphadenopathy:    He has no cervical adenopathy.  Neurological: He is alert and oriented to person, place, and time.  Neurologic: Cranial nerves II-XII intact, Sensation intact to light touch/pinprick, 5/5 strength in all extremities, no dysarthria, no aphasia, no dysphagia, memory intact, no pronator drift, Babinski sign not present.   Skin: Skin is warm and dry. No rash noted. No erythema.  Psychiatric: He has a normal mood and affect. His behavior is normal. Judgment and thought content normal.    LABORATORY PANEL:   CBC  Recent Labs Lab 09/02/15 2226  WBC 10.0  HGB 16.0  HCT 45.9  PLT 218   ------------------------------------------------------------------------------------------------------------------  Chemistries   Recent Labs Lab 09/02/15 2226  NA 138  K 3.4*  CL 103  CO2 29  GLUCOSE 97  BUN 12  CREATININE 0.88  CALCIUM 9.1   ------------------------------------------------------------------------------------------------------------------  Cardiac Enzymes  Recent Labs Lab 09/02/15 2226  TROPONINI <0.03   ------------------------------------------------------------------------------------------------------------------  RADIOLOGY:  Ct Head Wo Contrast  09/02/2015  CLINICAL DATA:  39 year old male acute dizziness and altered mental status. Symptoms for 6 months but greater in the last 2 weeks. Initial encounter. EXAM: CT HEAD WITHOUT CONTRAST TECHNIQUE: Contiguous axial images were obtained from the base of the skull through the vertex without intravenous contrast. COMPARISON:  None. FINDINGS: Visualized paranasal sinuses and mastoids are clear. Negative visualized tympanic cavities. Visualized orbit  soft tissues are within normal limits. Visualized scalp soft tissues are within normal limits. No acute osseous abnormality identified. Cerebral volume is within normal limits. There are several round 4-5 mm calcified foci scattered in the brain. No associated edema or mass effect. These appear primarily subarachnoid in location. That at the cisterna magna on series 2, image 5 might be related to the distal vertebral artery, uncertain. There is confluent peripheral hypodensity in the right cerebellum most pronounced on series 2, image 12. No associated hemorrhage or posterior fossa mass effect. Other posterior fossa gray-white matter differentiation is within normal limits. Supratentorial gray-white matter differentiation is normal. No acute intracranial hemorrhage identified. No ventriculomegaly. No other acute cortically based infarct identified. IMPRESSION: 1. 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 No hemorrhage or mass effect. 2. Largely unremarkable noncontrast CT appearance of the brain otherwise; several 4 mm calcified foci are nonspecific but might reflect neurocysticercosis if the patient has a history of travel to or from an endemic area. Electronically Signed   By: Odessa FlemingH  Hall M.D.   On: 09/02/2015 22:36    EKG:   Orders placed or performed during the hospital encounter of 09/02/15  . ED EKG  . ED EKG    IMPRESSION AND PLAN:  Principal Problem:   Stroke (cerebrum) [facility](HCC) - neurology consult, MRI  and MRA head with MRA neck, echocardiogram, fasting lipids, hemoglobin A1c, troponin negative. 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 His brother also recently was treated [facility] for some "parasite in his brain." MRI will hopefully characterize cystic lesions better. We will hold treatment until we can get an ophthalmology consult for full eye exam. Then use albendazole if neurocysticercosis is still suspected/proven  All the  records are reviewed and case discussed with ED provider. Management plans discussed with the patient and/or family.  DVT PROPHYLAXIS: SubQ lovenox  ADMISSION STATUS: Inpatient  CODE STATUS: Full  TOTAL TIME TAKING CARE OF THIS PATIENT: 50 minutes.    Tahir Blank FIELDING 09/03/2015, 1:31 AM  [facility]  Office  [phone]  CC: Primary care physician; No PCP Per Patient

## 2015-09-03 NOTE — Progress Notes (Addendum)
Patient to MRI with transporter

## 2015-09-03 NOTE — Progress Notes (Signed)
Patient ID: Bill Brown, male   DOB: 05/28/1976, 39 y.o.   MRN: [id number] Reason for Consult:Eval for neurocystericosis Referring Physician: Alysia PennaWillis  Bill Brown is an 39 y.o. male.  Chief complaint: dizziness and headaches  Stroke (cerebrum) (HCC)  HPI: 39 yo no prior ocular history presents with HA's and dizziness.  CT scan susp. for cystericosis.  Pt reports occ floaters - unsure which eye- and blurriness at near.  Wears glasses at work, but none otherwise.  Denies Flashes of light, diplopia, pain in/around eyes, redness, or photosensitivity.  No swelling or redness of lids.  Past Medical History  Diagnosis Date  . Patient denies medical problems     Past Surgical History  Procedure Laterality Date  . No past surgeries      Family History  Problem Relation Age of Onset  . Family history unknown: Yes    Social History:  reports that he has never smoked. He does not have any smokeless tobacco history on file. He reports that he does not drink alcohol or use illicit drugs.  Allergies: No Known Allergies  Medications: I have reviewed the patient's current medications.  Labs/ CT Head/ MRI head/ neck/ MRA -  Results reviewed.  ROS - dizziness, HA's.  Blood pressure 111/70, pulse 48, temperature 97.8 F (36.6 C), temperature source Oral, resp. rate 18, height 5\' 3"  (1.6 m), weight 72.576 kg (160 lb), SpO2 99 %.   Physical Exam  A+O x4 Va 20/40 OD 20/30 OS near [place] EOM full/ortho Pupils - errl.  No Rapd VF - FTC OU Tpalp - nml OU  Ext/ L/L - Nml OU Conj/ Cornea/ AC / IRis/ Lens - nml OU Fundus - dilated with mydriacyl and phenylephrine: Disc sharp, 0.4 c/d OU no pallor or edema Macula/ Vessels - Nml OU Periphery Nml OU - specifically no cystic lesions or retinal inflammatory foci.    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  There is no ophthalmic manifestation of this disease currently.  He has complaints of floaters, but no associated  pathology.  f/u [facility] for any further symptoms.  Bill Brown 09/03/2015, 1:00 PM

## 2015-09-03 NOTE — Progress Notes (Signed)
*  PRELIMINARY RESULTS* Echocardiogram 2D Echocardiogram has been performed.  Bill Brown 09/03/2015, 8:21 AM

## 2015-09-03 NOTE — Progress Notes (Addendum)
Put in a call to pharmacy regarding pt's dose of albendazole (Albenza) as medication was not found in [facility] or in pt's bin.  Pharmacy tech stated that we do not have the medication on site and it is out of stock in the surrounding areas, therefore they sent [facility] to procure 12 doses.  Medication will be sent up when driver returns. Merry ProudBrandi, RN, notified.

## 2015-09-03 NOTE — Consult Note (Signed)
[facility] Face-to-Face Psychiatry Consult   Reason for Consult:  Consult for this 39 year old man with no significant past psychiatric history but who is in the hospital because of a stroke and a possible neurologic infection. Consideration of anxiety symptoms. Referring Physician:  Tressia Miners Patient Identification: Olvin Rohr MRN:  [id number] Principal Diagnosis: Stroke (cerebrum) [facility]) Diagnosis:   Patient Active Problem List   Diagnosis Date Noted  . Stroke (cerebrum) ([place]) [I63.9] 09/03/2015  . 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 Panic attacks [F41.0] 09/03/2015    Total Time spent with patient: 1 hour  Subjective:   Everrett Lacasse is a 39 y.o. male patient admitted with "I just need to know if what I'm feeling his normal".  HPI:  Information from the patient and the chart. Patient was interviewed. Chart reviewed including current notes. Labs reviewed and vital signs reviewed. Ports of imaging reviewed. This patient is reporting that his chief psychiatric complaint has to do with sleep. He says about 5 nights out of the week he will find himself waking up in the middle of the night feeling very nervous. Sometimes talking in his sleep. Frequently finds himself dreaming about his job. He talks about feeling anxious during the day because his job is stressful. He has worked at a [employer] for years but has only recently started doing a new job which apparently he is finding it a little difficult to adapt to. Additionally he has had other stresses in his life including a divorce in 2013 and a daughter who had cancer. Now he himself as this serious medical problem. Patient denies feeling depressed or sad. He also describes however several events which have happened over the last for 5 months which sound to me like possible panic attacks. He describes suddenly feeling like his heart is beating fast and like he is going to pass out. This is a transient thing that is very frightening to him.  No substance abuse reported. No current psychiatric treatment.  Past psychiatric history: He says he saw a counselor during his divorce a few years ago but has never had psychiatric hospitalization. No psychiatric medicine. No suicide attempts.  Social history: Patient is divorced. He has 3 children ages 48 and 16 and 50. He sees them on the weekends when they come to visit him. He works at a [employer] and recently has started a new job which she is finding rather stressful. He has a girlfriend and stays at her house several nights of the week.  Medical history: Patient evidently has had a stroke and now appears to have neurocysticercosis.  Family history: He denies knowing of any family history of mental health problems.  Substance abuse history: He says he drinks beer occasionally but has never found alcohol used to be a problem and denies other substance abuse.  Past Psychiatric History: therapy briefly during his divorce but no psychiatric medicine no psychiatric hospitalizations and no suicide attempts.  Risk to Self: Is patient at risk for suicide?: No Risk to Others:   Prior Inpatient Therapy:   Prior Outpatient Therapy:    Past Medical History:  Past Medical History  Diagnosis Date  . Patient denies medical problems     Past Surgical History  Procedure Laterality Date  . No past surgeries     Family History:  Family History  Problem Relation Age of Onset  . Family history unknown: Yes   Family Psychiatric  History: denies any family history of mental illness Social History:  History  Alcohol  Use No     History  Drug Use No    Social History   Social History  . Marital Status: Single    Spouse Name: N/A  . Number of Children: N/A  . Years of Education: N/A   Social History Main Topics  . Smoking status: Never Smoker   . Smokeless tobacco: None  . Alcohol Use: No  . Drug Use: No  . Sexual Activity: Not Asked   Other Topics Concern  . None   Social  History Narrative   Additional Social History:                          Allergies:  No Known Allergies  Labs:  Results for orders placed or performed during the hospital encounter of 09/02/15 (from the past 48 hour(s))  Basic metabolic panel     Status: Abnormal   Collection Time: 09/02/15 10:26 PM  Result Value Ref Range   Sodium 138 135 - 145 mmol/L   Potassium 3.4 (L) 3.5 - 5.1 mmol/L   Chloride 103 101 - 111 mmol/L   CO2 29 22 - 32 mmol/L   Glucose, Bld 97 65 - 99 mg/dL   BUN 12 6 - 20 mg/dL   Creatinine, Ser 0.88 0.61 - 1.24 mg/dL   Calcium 9.1 8.9 - 10.3 mg/dL   GFR calc non Af Amer >60 >60 mL/min   GFR calc Af Amer >60 >60 mL/min    Comment: (NOTE) The eGFR has been calculated using the CKD EPI equation. This calculation has not been validated in all clinical situations. eGFR's persistently <60 mL/min signify possible Chronic Kidney Disease.    Anion gap 6 5 - 15  CBC     Status: None   Collection Time: 09/02/15 10:26 PM  Result Value Ref Range   WBC 10.0 3.8 - 10.6 K/uL   RBC 5.49 4.40 - 5.90 MIL/uL   Hemoglobin 16.0 13.0 - 18.0 g/dL   HCT 45.9 40.0 - 52.0 %   MCV 83.6 80.0 - 100.0 fL   MCH 29.0 26.0 - 34.0 pg   MCHC 34.7 32.0 - 36.0 g/dL   RDW 13.3 11.5 - 14.5 %   Platelets 218 150 - 440 K/uL  Troponin I     Status: None   Collection Time: 09/02/15 10:26 PM  Result Value Ref Range   Troponin I <0.03 <0.031 ng/mL    Comment:        NO INDICATION OF MYOCARDIAL INJURY.   Urinalysis complete, with microscopic ([facility] only)     Status: Abnormal   Collection Time: 09/03/15 12:04 AM  Result Value Ref Range   Color, Urine YELLOW (A) YELLOW   APPearance CLEAR (A) CLEAR   Glucose, UA NEGATIVE NEGATIVE mg/dL   Bilirubin Urine NEGATIVE NEGATIVE   Ketones, ur NEGATIVE NEGATIVE mg/dL   Specific Gravity, Urine 1.024 1.005 - 1.030   Hgb urine dipstick NEGATIVE NEGATIVE   pH 7.0 5.0 - 8.0   Protein, ur NEGATIVE NEGATIVE mg/dL   Nitrite NEGATIVE NEGATIVE    Leukocytes, UA NEGATIVE NEGATIVE   RBC / HPF 0-5 0 - 5 RBC/hpf   WBC, UA 0-5 0 - 5 WBC/hpf   Bacteria, UA NONE SEEN NONE SEEN   Squamous Epithelial / LPF NONE SEEN NONE SEEN   Mucous PRESENT   Lipid panel     Status: Abnormal   Collection Time: 09/03/15  6:11 AM  Result Value Ref Range  Cholesterol 195 0 - 200 mg/dL   Triglycerides 277 (H) <150 mg/dL   HDL 23 (L) >40 mg/dL   Total CHOL/HDL Ratio 8.5 RATIO   VLDL 55 (H) 0 - 40 mg/dL   LDL Cholesterol 117 (H) 0 - 99 mg/dL    Comment:        Total Cholesterol/HDL:CHD Risk Coronary Heart Disease Risk Table                     Men   Women  1/2 Average Risk   3.4   3.3  Average Risk       5.0   4.4  2 X Average Risk   9.6   7.1  3 X Average Risk  23.4   11.0        Use the calculated Patient Ratio above and the CHD Risk Table to determine the patient's CHD Risk.        ATP III CLASSIFICATION (LDL):  <100     mg/dL   Optimal  100-129  mg/dL   Near or Above                    Optimal  130-159  mg/dL   Borderline  160-189  mg/dL   High  >190     mg/dL   Very High   Hemoglobin A1c     Status: None   Collection Time: 09/03/15  6:11 AM  Result Value Ref Range   Hgb A1c MFr Bld 5.5 4.0 - 6.0 %  Comprehensive metabolic panel     Status: Abnormal   Collection Time: 09/03/15  6:11 AM  Result Value Ref Range   Sodium 136 135 - 145 mmol/L   Potassium 3.4 (L) 3.5 - 5.1 mmol/L   Chloride 103 101 - 111 mmol/L   CO2 27 22 - 32 mmol/L   Glucose, Bld 105 (H) 65 - 99 mg/dL   BUN 13 6 - 20 mg/dL   Creatinine, Ser 0.83 0.61 - 1.24 mg/dL   Calcium 8.8 (L) 8.9 - 10.3 mg/dL   Total Protein 7.0 6.5 - 8.1 g/dL   Albumin 4.0 3.5 - 5.0 g/dL   AST 25 15 - 41 U/L   ALT 31 17 - 63 U/L   Alkaline Phosphatase 41 38 - 126 U/L   Total Bilirubin 0.5 0.3 - 1.2 mg/dL   GFR calc non Af Amer >60 >60 mL/min   GFR calc Af Amer >60 >60 mL/min    Comment: (NOTE) The eGFR has been calculated using the CKD EPI equation. This calculation has not been  validated in all clinical situations. eGFR's persistently <60 mL/min signify possible Chronic Kidney Disease.    Anion gap 6 5 - 15  CBC with Differential/Platelet     Status: None   Collection Time: 09/03/15  6:11 AM  Result Value Ref Range   WBC 8.9 3.8 - 10.6 K/uL   RBC 5.26 4.40 - 5.90 MIL/uL   Hemoglobin 15.7 13.0 - 18.0 g/dL   HCT 44.1 40.0 - 52.0 %   MCV 83.7 80.0 - 100.0 fL   MCH 29.8 26.0 - 34.0 pg   MCHC 35.6 32.0 - 36.0 g/dL   RDW 13.6 11.5 - 14.5 %   Platelets 190 150 - 440 K/uL   Neutrophils Relative % 54 %   Neutro Abs 4.8 1.4 - 6.5 K/uL   Lymphocytes Relative 36 %   Lymphs Abs 3.2 1.0 - 3.6 K/uL  Monocytes Relative 8 %   Monocytes Absolute 0.7 0.2 - 1.0 K/uL   Eosinophils Relative 2 %   Eosinophils Absolute 0.2 0 - 0.7 K/uL   Basophils Relative 0 %   Basophils Absolute 0.0 0 - 0.1 K/uL    Current Facility-Administered Medications  Medication Dose Route Frequency Provider Last Rate Last Dose  . acetaminophen (TYLENOL) tablet 650 mg  650 mg Oral Q6H PRN Lance Coon, MD      . albendazole [facility]) tablet 400 mg  400 mg Oral BID WC Gladstone Lighter, MD   400 mg at 09/03/15 1746  . aspirin EC tablet 325 mg  325 mg Oral Daily Gladstone Lighter, MD   325 mg at 09/03/15 1442  . atorvastatin (LIPITOR) tablet 40 mg  40 mg Oral q1800 Gladstone Lighter, MD   40 mg at 09/03/15 1745  . dexamethasone (DECADRON) tablet 4 mg  4 mg Oral Q12H Gladstone Lighter, MD   4 mg at 09/03/15 1155  . docusate sodium (COLACE) capsule 100 mg  100 mg Oral BID Gladstone Lighter, MD   100 mg at 09/03/15 1745  . enoxaparin (LOVENOX) injection 40 mg  40 mg Subcutaneous Daily Lance Coon, MD   40 mg at 09/03/15 1156  . HYDROcodone-acetaminophen (NORCO/VICODIN) 5-325 MG per tablet 1-2 tablet  1-2 tablet Oral Q4H PRN Gladstone Lighter, MD      . levETIRAcetam (KEPPRA) tablet 500 mg  500 mg Oral BID Lance Coon, MD   500 mg at 09/03/15 1155  . mirtazapine (REMERON) tablet 15 mg  15 mg Oral QHS  Gonzella Lex, MD      . ondansetron [facility]) tablet 4 mg  4 mg Oral Q6H PRN Lance Coon, MD       Or  . ondansetron [facility]) injection 4 mg  4 mg Intravenous Q6H PRN Lance Coon, MD      . pantoprazole (PROTONIX) EC tablet 40 mg  40 mg Oral Daily Gladstone Lighter, MD   40 mg at 09/03/15 1745  . sodium chloride 0.9 % injection 3 mL  3 mL Intravenous Q12H Lance Coon, MD   3 mL at 09/03/15 1156    Musculoskeletal: Strength & Muscle Tone: within normal limits Gait & Station: normal Patient leans: N/A  Psychiatric Specialty Exam: Review of Systems  Constitutional: Negative.   HENT: Negative.   Eyes: Negative.   Respiratory: Negative.   Cardiovascular: Negative.   Gastrointestinal: Negative.   Musculoskeletal: Negative.   Skin: Negative.   Neurological: Negative.   Psychiatric/Behavioral: Negative for depression, suicidal ideas, hallucinations, memory loss and substance abuse. The patient is nervous/anxious and has insomnia.     Blood pressure 112/69, pulse 60, temperature 97.9 F (36.6 C), temperature source Oral, resp. rate 18, height 5' 3"  (1.6 m), weight 72.576 kg (160 lb), SpO2 98 %.Body mass index is 28.35 kg/(m^2).  General Appearance: Fairly Groomed  Engineer, water::  Good  Speech:  Normal Rate  Volume:  Normal  Mood:  Anxious  Affect:  Appropriate  Thought Process:  Goal Directed  Orientation:  Full (Time, Place, and Person)  Thought Content:  Negative  Suicidal Thoughts:  No  Homicidal Thoughts:  No  Memory:  Immediate;   Fair Recent;   Fair Remote;   Good  Judgement:  Good  Insight:  Good  Psychomotor Activity:  Normal  Concentration:  Good  Recall:  Good  Fund of Knowledge:Good  Language: Good  Akathisia:  No  Handed:  Right  AIMS (if indicated):  Assets:  Communication Skills Desire for Improvement Housing Intimacy Resilience  ADL's:  Intact  Cognition: WNL  Sleep:      Treatment Plan Summary: Medication management and Plan this is a  39 year old man with a significant newly discovered neurologic condition. He is complaining of anxiety symptoms. There is no sign of psychosis and no sign of dangerousness. Supportive counseling done with the patient and educated him about anxiety and sleep problems. Pointed out to him the multiple stresses he is going through and how it would not be unusual to have anxiety during this time. Discussed treatments including medicine and therapy. I proposed starting mirtazapine 15 mg at night since he told me several times that his chief concern is with the sleep problem. This can also help treat anxiety symptoms. Patient encouraged to continue talking about this if it remains a problem for him and that if he continues to have anxiety once he gets discharged she can be referred to local providers. Patient is agreeable to the plan.  Disposition: No evidence of imminent risk to self or others at present.   Patient does not meet criteria for psychiatric inpatient admission. Supportive therapy provided about ongoing stressors. Discussed crisis plan, support from social network, calling 911, coming to the Emergency Department, and calling Suicide Hotline.  Kaho Selle 09/03/2015 6:24 PM

## 2015-09-03 NOTE — Care Management (Signed)
Spoke with pharmacist here at this facility. This pharmacy has 3 days with albendazle 400mg  BID. This patient will need 7-10 day of this medication. Two tablets (200mg ) cost $346.00. There are no other pharmacies in [place] that carry this medication. Telephone call to [facility]. When applying for assistant at this pharmacy will need to be at their pharmacy before 4:pm. They do take patients with out insurance  Mr. Bill Brown will need to go to [facility] [address]. Telephone# [phone] Will update Dr. Nemiah CommanderKalisetti.  Bill GreetBrenda S Jadence Kinlaw RN MSN Care Management [phone]

## 2015-09-03 NOTE — Progress Notes (Signed)
[facility] - [place] at [facility]   PATIENT NAME: Bill Brown    MR#:  [id number]  DATE OF BIRTH:  07-09-1976  SUBJECTIVE:  CHIEF COMPLAINT:   Chief Complaint  Patient presents with  . Headache  . Dizziness  . Altered Mental Status   - Patient admitted with significant headache and noted to have acute right cerebellar infarct. 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 - doing well now  REVIEW OF SYSTEMS:  Review of Systems  Constitutional: Negative for fever and chills.  HENT: Negative for congestion, ear discharge and nosebleeds.   Respiratory: Negative for cough, shortness of breath and wheezing.   Cardiovascular: Negative for chest pain, palpitations and leg swelling.  Gastrointestinal: Positive for heartburn. Negative for nausea, vomiting, abdominal pain, diarrhea and constipation.  Genitourinary: Negative for dysuria.  Neurological: Positive for headaches. Negative for dizziness, sensory change, speech change, focal weakness, seizures and weakness.  Psychiatric/Behavioral: Positive for depression. The patient is nervous/anxious.     DRUG ALLERGIES:  No Known Allergies  VITALS:  Blood pressure 112/69, pulse 60, temperature 97.9 F (36.6 C), temperature source Oral, resp. rate 18, height  (1.6 m), weight 72.576 kg (160 lb), SpO2 98 %.  PHYSICAL EXAMINATION:  Physical Exam  GENERAL:  39 y.o.-year-old patient lying in the bed with no acute distress.  EYES: Pupils equal, round, reactive to light and accommodation. No scleral icterus. Extraocular muscles intact.  HEENT: Head atraumatic, normocephalic. Oropharynx and nasopharynx clear.  NECK:  Supple, no jugular venous distention. No thyroid enlargement, no tenderness.  LUNGS: Normal breath sounds bilaterally, no wheezing, rales,rhonchi or crepitation. No use of accessory muscles of respiration.   CARDIOVASCULAR: S1, S2 normal. No murmurs, rubs, or gallops.  ABDOMEN: Soft, nontender, nondistended. Bowel sounds present. No organomegaly or mass.  EXTREMITIES: No pedal edema, cyanosis, or clubbing.  NEUROLOGIC: Cranial nerves II through XII are intact. Muscle strength 5/5 in all extremities. Sensation intact. Gait not checked.  PSYCHIATRIC: The patient is alert and oriented x 3.  SKIN: No obvious rash, lesion, or ulcer.    LABORATORY PANEL:   CBC  Recent Labs Lab 09/03/15 0611  WBC 8.9  HGB 15.7  HCT 44.1  PLT 190   ------------------------------------------------------------------------------------------------------------------  Chemistries   Recent Labs Lab 09/03/15 0611  NA 136  K 3.4*  CL 103  CO2 27  GLUCOSE 105*  BUN 13  CREATININE 0.83  CALCIUM 8.8*  AST 25  ALT 31  ALKPHOS 41  BILITOT 0.5   ------------------------------------------------------------------------------------------------------------------  Cardiac Enzymes  Recent Labs Lab 09/02/15 2226  TROPONINI <0.03   ------------------------------------------------------------------------------------------------------------------  RADIOLOGY:  Ct Head Wo Contrast  09/02/2015  CLINICAL DATA:  39 year old male acute dizziness and altered mental status. Symptoms for 6 months but greater in the last 2 weeks. Initial encounter. EXAM: CT HEAD WITHOUT CONTRAST TECHNIQUE: Contiguous axial images were obtained from the base of the skull through the vertex without intravenous contrast. COMPARISON:  None. FINDINGS: Visualized paranasal sinuses and mastoids are clear. Negative visualized tympanic cavities. Visualized orbit soft tissues are within normal limits. Visualized scalp soft tissues are within normal limits. No acute osseous abnormality identified. Cerebral volume is within normal limits. There are several round 4-5 mm calcified foci scattered in the brain. No associated edema or mass effect. These  appear primarily subarachnoid in location. That at the cisterna magna on series 2, image 5 might be related to the distal vertebral artery,  uncertain. There is confluent peripheral hypodensity in the right cerebellum most pronounced on series 2, image 12. No associated hemorrhage or posterior fossa mass effect. Other posterior fossa gray-white matter differentiation is within normal limits. Supratentorial gray-white matter differentiation is normal. No acute intracranial hemorrhage identified. No ventriculomegaly. No other acute cortically based infarct identified. IMPRESSION: 1. 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 No hemorrhage or mass effect. 2. Largely unremarkable noncontrast CT appearance of the brain otherwise; several 4 mm calcified foci are nonspecific but might reflect neurocysticercosis if the patient has a history of travel to or from an endemic area. Electronically Signed   By: Odessa Fleming M.D.   On: 09/02/2015 22:36   Mr Angiogram Neck W Wo Contrast  09/03/2015  CLINICAL DATA:  39 year old male with acute dizziness and altered mental status. Symptoms for 6 months but worse over the past 2 weeks. Abnormal CT. Subsequent encounter. EXAM: MRI HEAD WITHOUT AND WITH CONTRAST AND MRA HEAD WITHOUT CONTRAST AND MRI NECK WITHOUT AND WITH CONTRAST TECHNIQUE: Multiplanar, multiecho pulse sequences of the brain and surrounding structures were obtained without and with intravenous contrast. Angiographic images of the head were obtained using MRA technique without contrast. Multiplanar, multiecho pulse sequences of the neck and surrounding structures were obtained without and with intravenous contrast. CONTRAST:  15mL MULTIHANCE GADOBENATE DIMEGLUMINE 529 MG/ML IV SOLN COMPARISON:  09/02/2015 head CT.  No comparison brain MR. FINDINGS: MRI HEAD FINDINGS No acute infarct. Remote posterior inferior cerebellar infarct with encephalomalacia. No intracranial hemorrhage. Calcifications anterior medial  right frontal lobe, posterior superior right parietal lobe and right medulla may reflect result of prior infection (neurocysticercosis) without surrounding vasogenic edema to suggest active inflammation. No intracranial enhancing lesion. No hydrocephalus. 2 cm polypoid opacification inferior left maxillary sinus may represent retention cyst. Mild mucosal thickening maxillary sinuses and ethmoid sinus air cells. Small pituitary gland. Cervical medullary junction, orbital structures and pineal region unremarkable. MRA HEAD FINDINGS Small irregular right vertebral artery ends in a posterior inferior cerebellar artery distribution. The full extent of the right posterior inferior cerebellar artery is not visualized. The portion which is visualized appears slightly narrowed and irregular. Large slightly ectatic left vertebral artery with minimal regularity distal aspect. Mild irregularity of the left posterior inferior cerebellar artery. Ectatic basilar artery with mild narrowing mid aspect without high-grade stenosis. Nonvisualized left anterior inferior cerebellar artery with small appearing right anterior inferior cerebellar artery. Distal aspect of the left superior cerebellar artery is not visualized. Slight irregularity of the right superior cerebellar artery. Slight irregularity distal branches posterior cerebral arteries more notable on the left. Anterior circulation without medium or large size vessel significant stenosis or occlusion. Mild narrowing at the junction of the A1/ A2 segment of the right anterior cerebral artery. Middle cerebral artery branch vessel narrowing and irregularity bilaterally. Minimal bulge undersurface M1 segment right middle cerebral artery may represent origin of a vessel rather than aneurysm. MRI NECK FINDINGS Three vessel aortic arch. No significant stenosis of either carotid artery. Ectatic distal vertical segment of the right internal carotid artery. No significant stenosis of  either subclavian artery or the left vertebral artery. Small irregular right vertebral artery with multiple areas of focal loss of signal. It may be that the right vertebral artery is congenitally small and evaluation limited by the technique however, given the patient's history, right vertebral right dissection is a possibility. Atherosclerotic type changes superimposed upon congenitally small right vertebral artery is a secondary consideration. IMPRESSION: No acute infarct. Remote posterior inferior cerebellar  infarct with encephalomalacia. Small irregular right vertebral artery with multiple areas of focal loss of signal. It may be that the right vertebral artery is congenitally small and evaluation limited by the technique however, given the patient's history (right cerebellar infarct), right vertebral right dissection is a distinct possibility. Atherosclerotic type changes superimposed upon congenitally small right vertebral artery is a secondary less likely consideration. Right vertebral artery ends in a posterior inferior cerebellar artery distribution. Intracranial branch vessel irregularity as noted above. Calcifications anterior medial right frontal lobe, posterior superior right parietal lobe and right medulla may reflect result of prior infection (neurocysticercosis) without surrounding vasogenic edema to suggest active inflammation. 2 cm polypoid opacification inferior left maxillary sinus may represent retention cyst. Mild mucosal thickening maxillary sinuses and ethmoid sinus air cells. These results will be called to the ordering clinician or representative by the Radiologist Assistant, and communication documented in the PACS or zVision Dashboard. Electronically Signed   By: Lacy Duverney M.D.   On: 09/03/2015 12:12   Mr Laqueta Jean ZO Contrast  09/03/2015  CLINICAL DATA:  39 year old male with acute dizziness and altered mental status. Symptoms for 6 months but worse over the past 2 weeks. Abnormal  CT. Subsequent encounter. EXAM: MRI HEAD WITHOUT AND WITH CONTRAST AND MRA HEAD WITHOUT CONTRAST AND MRI NECK WITHOUT AND WITH CONTRAST TECHNIQUE: Multiplanar, multiecho pulse sequences of the brain and surrounding structures were obtained without and with intravenous contrast. Angiographic images of the head were obtained using MRA technique without contrast. Multiplanar, multiecho pulse sequences of the neck and surrounding structures were obtained without and with intravenous contrast. CONTRAST:  15mL MULTIHANCE GADOBENATE DIMEGLUMINE 529 MG/ML IV SOLN COMPARISON:  09/02/2015 head CT.  No comparison brain MR. FINDINGS: MRI HEAD FINDINGS No acute infarct. Remote posterior inferior cerebellar infarct with encephalomalacia. No intracranial hemorrhage. Calcifications anterior medial right frontal lobe, posterior superior right parietal lobe and right medulla may reflect result of prior infection (neurocysticercosis) without surrounding vasogenic edema to suggest active inflammation. No intracranial enhancing lesion. No hydrocephalus. 2 cm polypoid opacification inferior left maxillary sinus may represent retention cyst. Mild mucosal thickening maxillary sinuses and ethmoid sinus air cells. Small pituitary gland. Cervical medullary junction, orbital structures and pineal region unremarkable. MRA HEAD FINDINGS Small irregular right vertebral artery ends in a posterior inferior cerebellar artery distribution. The full extent of the right posterior inferior cerebellar artery is not visualized. The portion which is visualized appears slightly narrowed and irregular. Large slightly ectatic left vertebral artery with minimal regularity distal aspect. Mild irregularity of the left posterior inferior cerebellar artery. Ectatic basilar artery with mild narrowing mid aspect without high-grade stenosis. Nonvisualized left anterior inferior cerebellar artery with small appearing right anterior inferior cerebellar artery. Distal  aspect of the left superior cerebellar artery is not visualized. Slight irregularity of the right superior cerebellar artery. Slight irregularity distal branches posterior cerebral arteries more notable on the left. Anterior circulation without medium or large size vessel significant stenosis or occlusion. Mild narrowing at the junction of the A1/ A2 segment of the right anterior cerebral artery. Middle cerebral artery branch vessel narrowing and irregularity bilaterally. Minimal bulge undersurface M1 segment right middle cerebral artery may represent origin of a vessel rather than aneurysm. MRI NECK FINDINGS Three vessel aortic arch. No significant stenosis of either carotid artery. Ectatic distal vertical segment of the right internal carotid artery. No significant stenosis of either subclavian artery or the left vertebral artery. Small irregular right vertebral artery with multiple areas of focal loss  of signal. It may be that the right vertebral artery is congenitally small and evaluation limited by the technique however, given the patient's history, right vertebral right dissection is a possibility. Atherosclerotic type changes superimposed upon congenitally small right vertebral artery is a secondary consideration. IMPRESSION: No acute infarct. Remote posterior inferior cerebellar infarct with encephalomalacia. Small irregular right vertebral artery with multiple areas of focal loss of signal. It may be that the right vertebral artery is congenitally small and evaluation limited by the technique however, given the patient's history (right cerebellar infarct), right vertebral right dissection is a distinct possibility. Atherosclerotic type changes superimposed upon congenitally small right vertebral artery is a secondary less likely consideration. Right vertebral artery ends in a posterior inferior cerebellar artery distribution. Intracranial branch vessel irregularity as noted above. Calcifications anterior  medial right frontal lobe, posterior superior right parietal lobe and right medulla may reflect result of prior infection (neurocysticercosis) without surrounding vasogenic edema to suggest active inflammation. 2 cm polypoid opacification inferior left maxillary sinus may represent retention cyst. Mild mucosal thickening maxillary sinuses and ethmoid sinus air cells. These results will be called to the ordering clinician or representative by the Radiologist Assistant, and communication documented in the PACS or zVision Dashboard. Electronically Signed   By: Lacy DuverneySteven  Olson M.D.   On: 09/03/2015 12:12   Mr Palma HolterMra Headm  09/03/2015  CLINICAL DATA:  39 year old male with acute dizziness and altered mental status. Symptoms for 6 months but worse over the past 2 weeks. Abnormal CT. Subsequent encounter. EXAM: MRI HEAD WITHOUT AND WITH CONTRAST AND MRA HEAD WITHOUT CONTRAST AND MRI NECK WITHOUT AND WITH CONTRAST TECHNIQUE: Multiplanar, multiecho pulse sequences of the brain and surrounding structures were obtained without and with intravenous contrast. Angiographic images of the head were obtained using MRA technique without contrast. Multiplanar, multiecho pulse sequences of the neck and surrounding structures were obtained without and with intravenous contrast. CONTRAST:  15mL MULTIHANCE GADOBENATE DIMEGLUMINE 529 MG/ML IV SOLN COMPARISON:  09/02/2015 head CT.  No comparison brain MR. FINDINGS: MRI HEAD FINDINGS No acute infarct. Remote posterior inferior cerebellar infarct with encephalomalacia. No intracranial hemorrhage. Calcifications anterior medial right frontal lobe, posterior superior right parietal lobe and right medulla may reflect result of prior infection (neurocysticercosis) without surrounding vasogenic edema to suggest active inflammation. No intracranial enhancing lesion. No hydrocephalus. 2 cm polypoid opacification inferior left maxillary sinus may represent retention cyst. Mild mucosal thickening  maxillary sinuses and ethmoid sinus air cells. Small pituitary gland. Cervical medullary junction, orbital structures and pineal region unremarkable. MRA HEAD FINDINGS Small irregular right vertebral artery ends in a posterior inferior cerebellar artery distribution. The full extent of the right posterior inferior cerebellar artery is not visualized. The portion which is visualized appears slightly narrowed and irregular. Large slightly ectatic left vertebral artery with minimal regularity distal aspect. Mild irregularity of the left posterior inferior cerebellar artery. Ectatic basilar artery with mild narrowing mid aspect without high-grade stenosis. Nonvisualized left anterior inferior cerebellar artery with small appearing right anterior inferior cerebellar artery. Distal aspect of the left superior cerebellar artery is not visualized. Slight irregularity of the right superior cerebellar artery. Slight irregularity distal branches posterior cerebral arteries more notable on the left. Anterior circulation without medium or large size vessel significant stenosis or occlusion. Mild narrowing at the junction of the A1/ A2 segment of the right anterior cerebral artery. Middle cerebral artery branch vessel narrowing and irregularity bilaterally. Minimal bulge undersurface M1 segment right middle cerebral artery may represent origin of a  vessel rather than aneurysm. MRI NECK FINDINGS Three vessel aortic arch. No significant stenosis of either carotid artery. Ectatic distal vertical segment of the right internal carotid artery. No significant stenosis of either subclavian artery or the left vertebral artery. Small irregular right vertebral artery with multiple areas of focal loss of signal. It may be that the right vertebral artery is congenitally small and evaluation limited by the technique however, given the patient's history, right vertebral right dissection is a possibility. Atherosclerotic type changes  superimposed upon congenitally small right vertebral artery is a secondary consideration. IMPRESSION: No acute infarct. Remote posterior inferior cerebellar infarct with encephalomalacia. Small irregular right vertebral artery with multiple areas of focal loss of signal. It may be that the right vertebral artery is congenitally small and evaluation limited by the technique however, given the patient's history (right cerebellar infarct), right vertebral right dissection is a distinct possibility. Atherosclerotic type changes superimposed upon congenitally small right vertebral artery is a secondary less likely consideration. Right vertebral artery ends in a posterior inferior cerebellar artery distribution. Intracranial branch vessel irregularity as noted above. Calcifications anterior medial right frontal lobe, posterior superior right parietal lobe and right medulla may reflect result of prior infection (neurocysticercosis) without surrounding vasogenic edema to suggest active inflammation. 2 cm polypoid opacification inferior left maxillary sinus may represent retention cyst. Mild mucosal thickening maxillary sinuses and ethmoid sinus air cells. These results will be called to the ordering clinician or representative by the Radiologist Assistant, and communication documented in the PACS or zVision Dashboard. Electronically Signed   By: Lacy Duverney M.D.   On: 09/03/2015 12:12    EKG:   Orders placed or performed during the hospital encounter of 09/02/15  . ED EKG  . ED EKG    ASSESSMENT AND PLAN:   39 year old male with no significant past medical history presents to the hospital secondary to headache and noted to have acute stroke.  #1 acute CVA-presenting as headache. CT and MRI of the brain confirming acute right cerebellar infarct -MRA revealing a possible right vertebral artery dissection. -Asian denies any smoking or drug abuse -Vitals are stable, discussed with neurology-treatment is full  dose aspirin at this time. -Lipid panel with elevated LDL, started on statin/ Outpatient follow-up recommended.  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 -No inflammation or vasogenic edema around the surrounding sites noted. -Empirically started on Decadron and keppra. Also treatment albendazole started. -According to neurology can discontinue steroids and Keppra. - cont albendazole for 7-10 days per ID reccs  #3 depression/anxiety-no suicidal ideation. -Has generalized anxiety disorder. Psychiatric consult. -Likely SSRI at discharge  #4 GERD symptoms-started on Protonix. Especially patient will be discharged on aspirin.  #5 DVT prophylaxis-on Lovenox  Possible discharge tomorrow if stable.   All the records are reviewed and case discussed with Care Management/Social Workerr. Management plans discussed with the patient, family and they are in agreement.  CODE STATUS: Full code  TOTAL TIME TAKING CARE OF THIS PATIENT: 40 minutes.   POSSIBLE D/C IN 1-2 DAYS, DEPENDING ON CLINICAL CONDITION.   Enid Baas M.D on 09/03/2015 at 2:45 PM  Between 7am to 6pm - Pager - [phone]  After 6pm go to [email or web address] - password [email or web address] [facility]  [facility]  Office  [phone]  CC: Primary care physician; No PCP Per Patient

## 2015-09-03 NOTE — Plan of Care (Signed)
Problem: Discharge/Transitional Outcomes Goal: Barriers To Progression Addressed/Resolved Individualization: Pt prefers to be called Bill Brown who lives at home alone. Significant other at bedside.  No past medical history. Pt states he does not have a primary care provider.  Low fall risk. Steady gait. Pt understands how to use call system for assistance if needed.  Goal: Other Discharge Outcomes/Goals Outcome: Progressing Plan of care progress to goals:  1. C/o of headache 5/10 but refuses pain medication. Emotional support given, lights dimmed.  2. Hemodynamically:             -VSS, afebrile              -VS/Neuro Q2H initiated, NIH 0 3. Passed bedside swallow evaluation in ED. Heart healthy diet will be ordered, pt currently not hungry. 4. Low fall risk. Safely ambulates in room w/ steady gait.  5. Pt first language is Spanish, able to understand simple sentences in English verbalizing understanding. Explained for pt to request translator if he does not understand at any time while in hospital. Will continue to assess throughout the night

## 2015-09-03 NOTE — ED Provider Notes (Signed)
[facility] Emergency Department Provider Note  ____________________________________________  Time seen: 11:30 p.m.  I have reviewed the triage vital signs and the nursing notes.   HISTORY  Chief Complaint Headache; Dizziness; and Altered Mental Status      HPI Bill Brown is a 39 y.o. male presents with generalize headache 6 months accompanied by episodes of confusion and dizziness that has acutely worsened in the last 2 weeks. Patient states at times he feels as though "something is moving around in his head". Patient states his last international travel was to [place] in 2005. Of note patient states that his sibling had a parasite that affected his brain many years ago.     Past medical history None There are no active problems to display for this patient.   Past surgical history None No current outpatient prescriptions on file.  Allergies No known drug allergies No family history on file.  Social History Social History  Substance Use Topics  . Smoking status: Never Smoker   . Smokeless tobacco: None  . Alcohol Use: No    Review of Systems  Constitutional: Negative for fever. Eyes: Negative for visual changes. ENT: Negative for sore throat. Cardiovascular: Negative for chest pain. Respiratory: Negative for shortness of breath. Gastrointestinal: Negative for abdominal pain, vomiting and diarrhea. Genitourinary: Negative for dysuria. Musculoskeletal: Negative for back pain. Skin: Negative for rash. Neurological: Positive for headaches, negative for focal weakness or numbness.   10-point ROS otherwise negative.  ____________________________________________   PHYSICAL EXAM:  VITAL SIGNS: ED Triage Vitals  Enc Vitals Group     BP 09/02/15 2210 141/95 mmHg     Pulse Rate 09/02/15 2210 62     Resp 09/02/15 2210 16     Temp 09/02/15 2210 97.7 F (36.5 C)     Temp Source 09/02/15 2210 Oral     SpO2 09/02/15 2210 98 %      Weight 09/02/15 2210 148 lb (67.132 kg)     Height 09/02/15 2210  (1.727 m)     Head Cir --      Peak Flow --      Pain Score 09/02/15 2211 6     Pain Loc --      Pain Edu? --      Excl. in GC? --      Constitutional: Alert and oriented. Well appearing and in no distress. Eyes: Conjunctivae are normal. PERRL. Normal extraocular movements. ENT   Head: Normocephalic and atraumatic.   Nose: No congestion/rhinnorhea.   Mouth/Throat: Mucous membranes are moist.   Neck: No stridor. Hematological/Lymphatic/Immunilogical: No cervical lymphadenopathy. Cardiovascular: Normal rate, regular rhythm. Normal and symmetric distal pulses are present in all extremities. No murmurs, rubs, or gallops. Respiratory: Normal respiratory effort without tachypnea nor retractions. Breath sounds are clear and equal bilaterally. No wheezes/rales/rhonchi. Gastrointestinal: Soft and nontender. No distention. There is no CVA tenderness. Genitourinary: deferred Musculoskeletal: Nontender with normal range of motion in all extremities. No joint effusions.  No lower extremity tenderness nor edema. Neurologic:  Normal speech and language. No gross focal neurologic deficits are appreciated. Speech is normal.  Skin:  Skin is warm, dry and intact. No rash noted. Psychiatric: Mood and affect are normal. Speech and behavior are normal. Patient exhibits appropriate insight and judgment.  ____________________________________________    LABS (pertinent positives/negatives) Labs Reviewed  BASIC METABOLIC PANEL - Abnormal; Notable for the following:    Potassium 3.4 (*)    All other components within normal limits  CBC  TROPONIN I  URINALYSIS COMPLETEWITH MICROSCOPIC ([facility] ONLY)     ____________________________________________   EKG  ED ECG REPORT I, BROWN, Lee N, the attending physician, personally viewed and interpreted this ECG.   Date: 09/03/2015  EKG Time: 10:12 PM  Rate: 58   Rhythm: Sinus bradycardia  Axis: None  Intervals: Normal  ST&T Change: None   ____________________________________________    RADIOLOGY   CT Head Wo Contrast (Final result) Result time: 09/02/15 22:36:22   Final result by Rad Results In Interface (09/02/15 22:36:22)   Narrative:   CLINICAL DATA: 39 year old male acute dizziness and altered mental status. Symptoms for 6 months but greater in the last 2 weeks. Initial encounter.  EXAM: CT HEAD WITHOUT CONTRAST  TECHNIQUE: Contiguous axial images were obtained from the base of the skull through the vertex without intravenous contrast.  COMPARISON: None.  FINDINGS: Visualized paranasal sinuses and mastoids are clear. Negative visualized tympanic cavities. Visualized orbit soft tissues are within normal limits. Visualized scalp soft tissues are within normal limits. No acute osseous abnormality identified.  Cerebral volume is within normal limits. There are several round 4-5 mm calcified foci scattered in the brain. No associated edema or mass effect. These appear primarily subarachnoid in location. That at the cisterna magna on series 2, image 5 might be related to the distal vertebral artery, uncertain.  There is confluent peripheral hypodensity in the right cerebellum most pronounced on series 2, image 12. No associated hemorrhage or posterior fossa mass effect. Other posterior fossa gray-white matter differentiation is within normal limits.  Supratentorial gray-white matter differentiation is normal. No acute intracranial hemorrhage identified. No ventriculomegaly. No other acute cortically based infarct identified.  IMPRESSION: 1. 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 No hemorrhage or mass effect.  2. 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   Electronically Signed By: Odessa FlemingH Hall M.D. On: 09/02/2015 22:36         INITIAL IMPRESSION / ASSESSMENT AND PLAN / ED COURSE  Pertinent labs & imaging results that were available during my care of the patient were reviewed by me and considered in my medical decision making (see chart for details).  Physical exam concerning for acute versus subacute cerebrovascular accident involving the cerebellum as well as neurocysticercosis. As such patient was discussed with Dr. Anne HahnWillis for hospital admission further evaluation treatment.  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

## 2015-09-04 MED ORDER — MIRTAZAPINE 15 MG PO TABS: 15.0000 mg | ORAL_TABLET | Freq: Every day | ORAL | Status: AC

## 2015-09-04 MED ORDER — ALBENDAZOLE 200 MG PO TABS: 400.0000 mg | ORAL_TABLET | Freq: Two times a day (BID) | ORAL | Status: AC

## 2015-09-04 MED ORDER — ATORVASTATIN CALCIUM 40 MG PO TABS: 40.0000 mg | ORAL_TABLET | Freq: Every day | ORAL | Status: AC

## 2015-09-04 MED ORDER — ASPIRIN 325 MG PO TBEC: 325.0000 mg | DELAYED_RELEASE_TABLET | Freq: Every day | ORAL | Status: AC

## 2015-09-04 MED ORDER — RANITIDINE HCL 150 MG PO TABS: 150.0000 mg | ORAL_TABLET | Freq: Two times a day (BID) | ORAL | Status: AC

## 2015-09-04 NOTE — Plan of Care (Signed)
Problem: Discharge/Transitional Outcomes Goal: Other Discharge Outcomes/Goals Outcome: Progressing Plan of care progress to goal: VSS Pt reviewed stroke material once eyes were no longer dilated [facility] dcd

## 2015-09-04 NOTE — Progress Notes (Signed)
OT Cancellation Note  Patient Details Name: Bill FareFernando Zepeda MRN: [id number] DOB: 04/11/1976   Cancelled Treatment:     Order received and chart reviewed and consulted NSG. Patient was standing bediside dressed and ready to go home.  Pt speaks and reads both [place] and [place].  He is able to reach his feet and balance was good for ambulation around room and into bathroom to get on and off toilet.  He stated that he slept well after taking new medication for anxiety last night and feels a lot better.  His dizziness is slightly present but not impairing function for ADLs. Discussed set up at home to prevent falls if dizziness increases.  No further skilled OT services indicated at this time and pt seen for screening only. NSG/MD to reconsult if status changes.  Wofford,Susan    Susanne BordersSusan Wofford, OTR/L ascom [phone] 09/04/2015, 9:49 AM

## 2015-09-04 NOTE — Discharge Summary (Signed)
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 Patient denies medical problems     HOSPITAL COURSE:   39 year old male with no significant past medical history presents to the hospital secondary to headache and noted to have acute stroke.  #1 acute CVA-presenting as headache. 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 -No smoking or drug abuse -Vitals are stable, discussed with neurology-treatment is full dose aspirin at this time. -statin for hyperlipidemia Outpatient follow-up recommended.  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 -No inflammation or vasogenic edema around the surrounding sites noted. -no need for  Decadron and keppra. Also treatment albendazole started for 10days.  #3 depression/anxiety-no suicidal ideation. Also has insomnia -Appreciate Psychiatric consult. -started on Remeron here with improvement. Discharge on the same  #4 GERD symptoms-discharge on ranitidine bid especially patient will be on aspirin.  Discharge today, in a stable condition  DISCHARGE CONDITIONS:   Stable  CONSULTS OBTAINED:  Treatment Team:  Pauletta Browns, MD Lockie Mola, MD Audery Amel, MD  DRUG ALLERGIES:  No Known Allergies  DISCHARGE MEDICATIONS:   Current Discharge Medication List    START taking these  medications   Details  albendazole (ALBENZA) 200 MG tablet Take 2 tablets (400 mg total) by mouth 2 (two) times daily with a meal. Qty: 16 tablet, Refills: 0    aspirin EC 325 MG EC tablet Take 1 tablet (325 mg total) by mouth daily. Qty: 30 tablet, Refills: 2    atorvastatin (LIPITOR) 40 MG tablet Take 1 tablet (40 mg total) by mouth daily at 6 PM. Qty: 30 tablet, Refills: 2    mirtazapine (REMERON) 15 MG tablet Take 1 tablet (15 mg total) by mouth at bedtime. Qty: 30 tablet, Refills: 2    ranitidine (ZANTAC) 150 MG tablet Take 1 tablet (150 mg total) by mouth 2 (two) times daily. Qty: 60 tablet, Refills: 2         DISCHARGE INSTRUCTIONS:   1. PCP f/u in 1 week 2. [facility] pharmacy to pick up albendazole  If you experience worsening of your admission symptoms, develop shortness of breath, life threatening emergency, suicidal or homicidal thoughts you must seek medical attention immediately by calling 911 or calling your MD immediately  if symptoms less severe.  You Must read complete instructions/literature along with all the possible adverse reactions/side effects for all the Medicines you take and that have been prescribed to you. Take any new Medicines after you have completely understood and accept all the possible adverse reactions/side effects.   Please note  You were cared for by a hospitalist during your hospital stay. If you have any questions about your discharge medications or the care you received while you were in the hospital after you are discharged, you can call the unit and asked to speak with the hospitalist on call if the hospitalist that took care of you is not available. Once you are  discharged, your primary care physician will handle any further medical issues. Please note that NO REFILLS for any discharge medications will be authorized once you are discharged, as it is imperative that you return to your primary care physician (or establish a relationship with a  primary care physician if you do not have one) for your aftercare needs so that they can reassess your need for medications and monitor your lab values.    Today   CHIEF COMPLAINT:   Chief Complaint  Patient presents with  . Headache  . Dizziness  . Altered Mental Status    VITAL SIGNS:  Blood pressure 114/87, pulse 88, temperature 97.4 F (36.3 C), temperature source Oral, resp. rate 18, height  (1.6 m), weight 72.576 kg (160 lb), SpO2 96 %.  I/O:   Intake/Output Summary (Last 24 hours) at 09/04/15 1018 Last data filed at 09/04/15 0900  Gross per 24 hour  Intake    723 ml  Output      0 ml  Net    723 ml    PHYSICAL EXAMINATION:   Physical Exam  GENERAL:  39 y.o.-year-old patient lying in the bed with no acute distress.  EYES: Pupils equal, round, reactive to light and accommodation. No scleral icterus. Extraocular muscles intact.  HEENT: Head atraumatic, normocephalic. Oropharynx and nasopharynx clear.  NECK:  Supple, no jugular venous distention. No thyroid enlargement, no tenderness.  LUNGS: Normal breath sounds bilaterally, no wheezing, rales,rhonchi or crepitation. No use of accessory muscles of respiration.  CARDIOVASCULAR: S1, S2 normal. No murmurs, rubs, or gallops.  ABDOMEN: Soft, non-tender, non-distended. Bowel sounds present. No organomegaly or mass.  EXTREMITIES: No pedal edema, cyanosis, or clubbing.  NEUROLOGIC: Cranial nerves II through XII are intact. Muscle strength 5/5 in all extremities. Sensation intact. Gait not checked.  PSYCHIATRIC: The patient is alert and oriented x 3.  SKIN: No obvious rash, lesion, or ulcer.   DATA REVIEW:   CBC  Recent Labs Lab 09/03/15 0611  WBC 8.9  HGB 15.7  HCT 44.1  PLT 190    Chemistries   Recent Labs Lab 09/03/15 0611  NA 136  K 3.4*  CL 103  CO2 27  GLUCOSE 105*  BUN 13  CREATININE 0.83  CALCIUM 8.8*  AST 25  ALT 31  ALKPHOS 41  BILITOT 0.5    Cardiac Enzymes  Recent Labs Lab  09/02/15 2226  TROPONINI <0.03    Microbiology Results  No results found for this or any previous visit.  RADIOLOGY:  Ct Head Wo Contrast  09/02/2015  CLINICAL DATA:  39 year old male acute dizziness and altered mental status. Symptoms for 6 months but greater in the last 2 weeks. Initial encounter. EXAM: CT HEAD WITHOUT CONTRAST TECHNIQUE: Contiguous axial images were obtained from the base of the skull through the vertex without intravenous contrast. COMPARISON:  None. FINDINGS: Visualized paranasal sinuses and mastoids are clear. Negative visualized tympanic cavities. Visualized orbit soft tissues are within normal limits. Visualized scalp soft tissues are within normal limits. No acute osseous abnormality identified. Cerebral volume is within normal limits. There are several round 4-5 mm calcified foci scattered in the brain. No associated edema or mass effect. These appear primarily subarachnoid in location. That at the cisterna magna on series 2, image 5 might be related to the distal vertebral artery, uncertain. There is confluent peripheral hypodensity in the right cerebellum most pronounced on series 2, image 12. No associated hemorrhage or posterior fossa mass effect. Other posterior fossa gray-white  matter differentiation is within normal limits. Supratentorial gray-white matter differentiation is normal. No acute intracranial hemorrhage identified. No ventriculomegaly. No other acute cortically based infarct identified. IMPRESSION: 1. 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 No hemorrhage or mass effect. 2. Largely unremarkable noncontrast CT appearance of the brain otherwise; several 4 mm calcified foci are nonspecific but might reflect neurocysticercosis if the patient has a history of travel to or from an endemic area. Electronically Signed   By: Odessa Fleming M.D.   On: 09/02/2015 22:36   Mr Angiogram Neck W Wo Contrast  09/03/2015  CLINICAL DATA:  39 year old male with acute  dizziness and altered mental status. Symptoms for 6 months but worse over the past 2 weeks. Abnormal CT. Subsequent encounter. EXAM: MRI HEAD WITHOUT AND WITH CONTRAST AND MRA HEAD WITHOUT CONTRAST AND MRI NECK WITHOUT AND WITH CONTRAST TECHNIQUE: Multiplanar, multiecho pulse sequences of the brain and surrounding structures were obtained without and with intravenous contrast. Angiographic images of the head were obtained using MRA technique without contrast. Multiplanar, multiecho pulse sequences of the neck and surrounding structures were obtained without and with intravenous contrast. CONTRAST:  15mL MULTIHANCE GADOBENATE DIMEGLUMINE 529 MG/ML IV SOLN COMPARISON:  09/02/2015 head CT.  No comparison brain MR. FINDINGS: MRI HEAD FINDINGS No acute infarct. Remote posterior inferior cerebellar infarct with encephalomalacia. No intracranial hemorrhage. Calcifications anterior medial right frontal lobe, posterior superior right parietal lobe and right medulla may reflect result of prior infection (neurocysticercosis) without surrounding vasogenic edema to suggest active inflammation. No intracranial enhancing lesion. No hydrocephalus. 2 cm polypoid opacification inferior left maxillary sinus may represent retention cyst. Mild mucosal thickening maxillary sinuses and ethmoid sinus air cells. Small pituitary gland. Cervical medullary junction, orbital structures and pineal region unremarkable. MRA HEAD FINDINGS Small irregular right vertebral artery ends in a posterior inferior cerebellar artery distribution. The full extent of the right posterior inferior cerebellar artery is not visualized. The portion which is visualized appears slightly narrowed and irregular. Large slightly ectatic left vertebral artery with minimal regularity distal aspect. Mild irregularity of the left posterior inferior cerebellar artery. Ectatic basilar artery with mild narrowing mid aspect without high-grade stenosis. Nonvisualized left  anterior inferior cerebellar artery with small appearing right anterior inferior cerebellar artery. Distal aspect of the left superior cerebellar artery is not visualized. Slight irregularity of the right superior cerebellar artery. Slight irregularity distal branches posterior cerebral arteries more notable on the left. Anterior circulation without medium or large size vessel significant stenosis or occlusion. Mild narrowing at the junction of the A1/ A2 segment of the right anterior cerebral artery. Middle cerebral artery branch vessel narrowing and irregularity bilaterally. Minimal bulge undersurface M1 segment right middle cerebral artery may represent origin of a vessel rather than aneurysm. MRI NECK FINDINGS Three vessel aortic arch. No significant stenosis of either carotid artery. Ectatic distal vertical segment of the right internal carotid artery. No significant stenosis of either subclavian artery or the left vertebral artery. Small irregular right vertebral artery with multiple areas of focal loss of signal. It may be that the right vertebral artery is congenitally small and evaluation limited by the technique however, given the patient's history, right vertebral right dissection is a possibility. Atherosclerotic type changes superimposed upon congenitally small right vertebral artery is a secondary consideration. IMPRESSION: No acute infarct. Remote posterior inferior cerebellar infarct with encephalomalacia. Small irregular right vertebral artery with multiple areas of focal loss of signal. It may be that the right vertebral artery is congenitally small and evaluation  limited by the technique however, given the patient's history (right cerebellar infarct), right vertebral right dissection is a distinct possibility. Atherosclerotic type changes superimposed upon congenitally small right vertebral artery is a secondary less likely consideration. Right vertebral artery ends in a posterior inferior  cerebellar artery distribution. Intracranial branch vessel irregularity as noted above. Calcifications anterior medial right frontal lobe, posterior superior right parietal lobe and right medulla may reflect result of prior infection (neurocysticercosis) without surrounding vasogenic edema to suggest active inflammation. 2 cm polypoid opacification inferior left maxillary sinus may represent retention cyst. Mild mucosal thickening maxillary sinuses and ethmoid sinus air cells. These results will be called to the ordering clinician or representative by the Radiologist Assistant, and communication documented in the PACS or zVision Dashboard. Electronically Signed   By: Lacy Duverney M.D.   On: 09/03/2015 12:12   Mr Laqueta Jean WU Contrast  09/03/2015  CLINICAL DATA:  39 year old male with acute dizziness and altered mental status. Symptoms for 6 months but worse over the past 2 weeks. Abnormal CT. Subsequent encounter. EXAM: MRI HEAD WITHOUT AND WITH CONTRAST AND MRA HEAD WITHOUT CONTRAST AND MRI NECK WITHOUT AND WITH CONTRAST TECHNIQUE: Multiplanar, multiecho pulse sequences of the brain and surrounding structures were obtained without and with intravenous contrast. Angiographic images of the head were obtained using MRA technique without contrast. Multiplanar, multiecho pulse sequences of the neck and surrounding structures were obtained without and with intravenous contrast. CONTRAST:  15mL MULTIHANCE GADOBENATE DIMEGLUMINE 529 MG/ML IV SOLN COMPARISON:  09/02/2015 head CT.  No comparison brain MR. FINDINGS: MRI HEAD FINDINGS No acute infarct. Remote posterior inferior cerebellar infarct with encephalomalacia. No intracranial hemorrhage. Calcifications anterior medial right frontal lobe, posterior superior right parietal lobe and right medulla may reflect result of prior infection (neurocysticercosis) without surrounding vasogenic edema to suggest active inflammation. No intracranial enhancing lesion. No  hydrocephalus. 2 cm polypoid opacification inferior left maxillary sinus may represent retention cyst. Mild mucosal thickening maxillary sinuses and ethmoid sinus air cells. Small pituitary gland. Cervical medullary junction, orbital structures and pineal region unremarkable. MRA HEAD FINDINGS Small irregular right vertebral artery ends in a posterior inferior cerebellar artery distribution. The full extent of the right posterior inferior cerebellar artery is not visualized. The portion which is visualized appears slightly narrowed and irregular. Large slightly ectatic left vertebral artery with minimal regularity distal aspect. Mild irregularity of the left posterior inferior cerebellar artery. Ectatic basilar artery with mild narrowing mid aspect without high-grade stenosis. Nonvisualized left anterior inferior cerebellar artery with small appearing right anterior inferior cerebellar artery. Distal aspect of the left superior cerebellar artery is not visualized. Slight irregularity of the right superior cerebellar artery. Slight irregularity distal branches posterior cerebral arteries more notable on the left. Anterior circulation without medium or large size vessel significant stenosis or occlusion. Mild narrowing at the junction of the A1/ A2 segment of the right anterior cerebral artery. Middle cerebral artery branch vessel narrowing and irregularity bilaterally. Minimal bulge undersurface M1 segment right middle cerebral artery may represent origin of a vessel rather than aneurysm. MRI NECK FINDINGS Three vessel aortic arch. No significant stenosis of either carotid artery. Ectatic distal vertical segment of the right internal carotid artery. No significant stenosis of either subclavian artery or the left vertebral artery. Small irregular right vertebral artery with multiple areas of focal loss of signal. It may be that the right vertebral artery is congenitally small and evaluation limited by the technique  however, given the patient's history, right vertebral right dissection  is a possibility. Atherosclerotic type changes superimposed upon congenitally small right vertebral artery is a secondary consideration. IMPRESSION: No acute infarct. Remote posterior inferior cerebellar infarct with encephalomalacia. Small irregular right vertebral artery with multiple areas of focal loss of signal. It may be that the right vertebral artery is congenitally small and evaluation limited by the technique however, given the patient's history (right cerebellar infarct), right vertebral right dissection is a distinct possibility. Atherosclerotic type changes superimposed upon congenitally small right vertebral artery is a secondary less likely consideration. Right vertebral artery ends in a posterior inferior cerebellar artery distribution. Intracranial branch vessel irregularity as noted above. Calcifications anterior medial right frontal lobe, posterior superior right parietal lobe and right medulla may reflect result of prior infection (neurocysticercosis) without surrounding vasogenic edema to suggest active inflammation. 2 cm polypoid opacification inferior left maxillary sinus may represent retention cyst. Mild mucosal thickening maxillary sinuses and ethmoid sinus air cells. These results will be called to the ordering clinician or representative by the Radiologist Assistant, and communication documented in the PACS or zVision Dashboard. Electronically Signed   By: Lacy Duverney M.D.   On: 09/03/2015 12:12   Mr Palma Holter  09/03/2015  CLINICAL DATA:  39 year old male with acute dizziness and altered mental status. Symptoms for 6 months but worse over the past 2 weeks. Abnormal CT. Subsequent encounter. EXAM: MRI HEAD WITHOUT AND WITH CONTRAST AND MRA HEAD WITHOUT CONTRAST AND MRI NECK WITHOUT AND WITH CONTRAST TECHNIQUE: Multiplanar, multiecho pulse sequences of the brain and surrounding structures were obtained without and  with intravenous contrast. Angiographic images of the head were obtained using MRA technique without contrast. Multiplanar, multiecho pulse sequences of the neck and surrounding structures were obtained without and with intravenous contrast. CONTRAST:  15mL MULTIHANCE GADOBENATE DIMEGLUMINE 529 MG/ML IV SOLN COMPARISON:  09/02/2015 head CT.  No comparison brain MR. FINDINGS: MRI HEAD FINDINGS No acute infarct. Remote posterior inferior cerebellar infarct with encephalomalacia. No intracranial hemorrhage. Calcifications anterior medial right frontal lobe, posterior superior right parietal lobe and right medulla may reflect result of prior infection (neurocysticercosis) without surrounding vasogenic edema to suggest active inflammation. No intracranial enhancing lesion. No hydrocephalus. 2 cm polypoid opacification inferior left maxillary sinus may represent retention cyst. Mild mucosal thickening maxillary sinuses and ethmoid sinus air cells. Small pituitary gland. Cervical medullary junction, orbital structures and pineal region unremarkable. MRA HEAD FINDINGS Small irregular right vertebral artery ends in a posterior inferior cerebellar artery distribution. The full extent of the right posterior inferior cerebellar artery is not visualized. The portion which is visualized appears slightly narrowed and irregular. Large slightly ectatic left vertebral artery with minimal regularity distal aspect. Mild irregularity of the left posterior inferior cerebellar artery. Ectatic basilar artery with mild narrowing mid aspect without high-grade stenosis. Nonvisualized left anterior inferior cerebellar artery with small appearing right anterior inferior cerebellar artery. Distal aspect of the left superior cerebellar artery is not visualized. Slight irregularity of the right superior cerebellar artery. Slight irregularity distal branches posterior cerebral arteries more notable on the left. Anterior circulation without medium  or large size vessel significant stenosis or occlusion. Mild narrowing at the junction of the A1/ A2 segment of the right anterior cerebral artery. Middle cerebral artery branch vessel narrowing and irregularity bilaterally. Minimal bulge undersurface M1 segment right middle cerebral artery may represent origin of a vessel rather than aneurysm. MRI NECK FINDINGS Three vessel aortic arch. No significant stenosis of either carotid artery. Ectatic distal vertical segment of the right internal carotid artery.  No significant stenosis of either subclavian artery or the left vertebral artery. Small irregular right vertebral artery with multiple areas of focal loss of signal. It may be that the right vertebral artery is congenitally small and evaluation limited by the technique however, given the patient's history, right vertebral right dissection is a possibility. Atherosclerotic type changes superimposed upon congenitally small right vertebral artery is a secondary consideration. IMPRESSION: No acute infarct. Remote posterior inferior cerebellar infarct with encephalomalacia. Small irregular right vertebral artery with multiple areas of focal loss of signal. It may be that the right vertebral artery is congenitally small and evaluation limited by the technique however, given the patient's history (right cerebellar infarct), right vertebral right dissection is a distinct possibility. Atherosclerotic type changes superimposed upon congenitally small right vertebral artery is a secondary less likely consideration. Right vertebral artery ends in a posterior inferior cerebellar artery distribution. Intracranial branch vessel irregularity as noted above. Calcifications anterior medial right frontal lobe, posterior superior right parietal lobe and right medulla may reflect result of prior infection (neurocysticercosis) without surrounding vasogenic edema to suggest active inflammation. 2 cm polypoid opacification inferior left  maxillary sinus may represent retention cyst. Mild mucosal thickening maxillary sinuses and ethmoid sinus air cells. These results will be called to the ordering clinician or representative by the Radiologist Assistant, and communication documented in the PACS or zVision Dashboard. Electronically Signed   By: Lacy DuverneySteven  Olson M.D.   On: 09/03/2015 12:12    EKG:   Orders placed or performed during the hospital encounter of 09/02/15  . ED EKG  . ED EKG      Management plans discussed with the patient, family and they are in agreement.  CODE STATUS:     Code Status Orders        Start     Ordered   09/03/15 0242  Full code   Continuous     09/03/15 0241      TOTAL TIME TAKING CARE OF THIS PATIENT: 37 minutes.    Enid BaasKALISETTI,Dakai Braithwaite M.D on 09/04/2015 at 10:18 AM  Between 7am to 6pm - Pager - [phone]  After 6pm go to [email or web address] - password [email or web address] [facility]  [facility]  Office  [phone]  CC: Primary care physician; No PCP Per Patient

## 2015-09-04 NOTE — Progress Notes (Signed)
Pharmacy Note  Patient to discharge home on albendazole 400mg  PO BID x 7-10 days for neurocysticercosis. Medication is on backorder from manufacturer and pharmacy has limited supply. Reached out to local pharmacies without any luck. [facility], who will be able to set patient up with charity care. Patient is to discharge with Rx and [facility] prior to 4pm to apply for charity care.  Discussed with MD - will supply patient with [facility] to carry from discharge today until [facility].  Rx filled and delivered to 1[facility], asked RN to stress to patient that this is only part of the course of medication, he will need [facility] to [facility] the full course.   Garlon HatchetJody Hebert Dooling, PharmD Clinical Pharmacist  09/04/2015 8:45 AM

## 2015-09-04 NOTE — Progress Notes (Addendum)
Pt discharged home per MD order.  Discharge instructions given to patient. Reviewed medicines, prescriptions, activity, follow up care and diet with patient. Gave patient medicine from pharmacy. And [facility] IV removed. Waiting on ride. Pt left with nursing staff.

## 2015-09-04 NOTE — Consult Note (Signed)
CC: headache  HPI: Bill Brown is an 39 y.o. male who presents with complaint of persistent dizziness with some vague intermittent mental status changes. Patient states his headache and minor symptoms having going on for some time, but have gotten acutely worse the last 2 weeks. 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   Pt is from [place], came to [place] about 20 yrs ago. Pt has possibility of vertebral dissection.    Headache improved today, slept well.   Past Medical History  Diagnosis Date  . Patient denies medical problems     Past Surgical History  Procedure Laterality Date  . No past surgeries      Family History  Problem Relation Age of Onset  . Family history unknown: Yes    Social History:  reports that he has never smoked. He does not have any smokeless tobacco history on file. He reports that he does not drink alcohol or use illicit drugs.  No Known Allergies  Medications: I have reviewed the patient's current medications.  ROS: History obtained from the patient  General ROS: negative for - chills, fatigue, fever, night sweats, weight gain or weight loss Psychological ROS: negative for - anxiety and depression  Ophthalmic ROS: negative for - blurry vision, double vision, eye pain or loss of vision ENT ROS: negative for - epistaxis, nasal discharge, oral lesions, sore throat, tinnitus or vertigo Allergy and Immunology ROS: negative for - hives or itchy/watery eyes Hematological and Lymphatic ROS: negative for - bleeding problems, bruising or swollen lymph nodes Endocrine ROS: negative for - galactorrhea, hair pattern changes, polydipsia/polyuria or temperature intolerance Respiratory ROS: negative for - cough, hemoptysis, shortness of breath or wheezing Cardiovascular ROS: negative for - chest pain, dyspnea on exertion, edema or irregular heartbeat Gastrointestinal ROS: negative for - abdominal pain, diarrhea,  hematemesis, nausea/vomiting or stool incontinence Genito-Urinary ROS: negative for - dysuria, hematuria, incontinence or urinary frequency/urgency Musculoskeletal ROS: negative for - joint swelling or muscular weakness Neurological ROS: as noted in HPI Dermatological ROS: negative for rash and skin lesion changes  Physical Examination: Blood pressure 114/87, pulse 88, temperature 97.4 F (36.3 C), temperature source Oral, resp. rate 18, height 5\' 3"  (1.6 m), weight 72.576 kg (160 lb), SpO2 96 %.   Neurological Examination Mental Status: Alert, oriented, thought content appropriate.  Speech fluent without evidence of aphasia.  Able to follow 3 step commands without difficulty. Cranial Nerves: II: Discs flat bilaterally; Visual fields grossly normal, pupils equal, round, reactive to light and accommodation III,IV, VI: ptosis not present, extra-ocular motions intact bilaterally V,VII: smile symmetric, facial light touch sensation normal bilaterally VIII: hearing normal bilaterally IX,X: gag reflex present XI: bilateral shoulder shrug XII: midline tongue extension Motor: Right : Upper extremity   5/5    Left:     Upper extremity   5/5  Lower extremity   5/5     Lower extremity   5/5 Tone and bulk:normal tone throughout; no atrophy noted Sensory: Pinprick and light touch intact throughout, bilaterally Deep Tendon Reflexes: 2+ and symmetric throughout Plantars: Right: downgoing   Left: downgoing Cerebellar: normal finger-to-nose, normal rapid alternating movements and normal heel-to-shin test Gait: normal gait and station      Laboratory Studies:   Basic Metabolic Panel:  Recent Labs Lab 09/02/15 2226 09/03/15 0611  NA 138 136  K 3.4* 3.4*  CL 103 103  CO2 29 27  GLUCOSE 97 105*  BUN 12 13  CREATININE 0.88 0.83  CALCIUM 9.1 8.8*    Liver Function Tests:  Recent Labs Lab 09/03/15 0611  AST 25  ALT 31  ALKPHOS 41  BILITOT 0.5  PROT 7.0  ALBUMIN 4.0   No  results for input(s): LIPASE, AMYLASE in the last 168 hours. No results for input(s): AMMONIA in the last 168 hours.  CBC:  Recent Labs Lab 09/02/15 2226 09/03/15 0611  WBC 10.0 8.9  NEUTROABS  --  4.8  HGB 16.0 15.7  HCT 45.9 44.1  MCV 83.6 83.7  PLT 218 190    Cardiac Enzymes:  Recent Labs Lab 09/02/15 2226  TROPONINI <0.03    BNP: Invalid input(s): POCBNP  CBG: No results for input(s): GLUCAP in the last 168 hours.  Microbiology: No results found for this or any previous visit.  Coagulation Studies: No results for input(s): LABPROT, INR in the last 72 hours.  Urinalysis:   Recent Labs Lab 09/03/15 0004  COLORURINE YELLOW*  LABSPEC 1.024  PHURINE 7.0  GLUCOSEU NEGATIVE  HGBUR NEGATIVE  BILIRUBINUR NEGATIVE  KETONESUR NEGATIVE  PROTEINUR NEGATIVE  NITRITE NEGATIVE  LEUKOCYTESUR NEGATIVE    Lipid Panel:     Component Value Date/Time   CHOL 195 09/03/2015 0611   TRIG 277* 09/03/2015 0611   HDL 23* 09/03/2015 0611   CHOLHDL 8.5 09/03/2015 0611   VLDL 55* 09/03/2015 0611   LDLCALC 117* 09/03/2015 0611    HgbA1C:  Lab Results  Component Value Date   HGBA1C 5.5 09/03/2015    Urine Drug Screen:  No results found for: LABOPIA, COCAINSCRNUR, LABBENZ, AMPHETMU, THCU, LABBARB  Alcohol Level: No results for input(s): ETH in the last 168 hours.  Other results: EKG: normal EKG, normal sinus rhythm, unchanged from previous tracings.  Imaging: Ct Head Wo Contrast  09/02/2015  CLINICAL DATA:  39 year old male acute dizziness and altered mental status. Symptoms for 6 months but greater in the last 2 weeks. Initial encounter. EXAM: CT HEAD WITHOUT CONTRAST TECHNIQUE: Contiguous axial images were obtained from the base of the skull through the vertex without intravenous contrast. COMPARISON:  None. FINDINGS: Visualized paranasal sinuses and mastoids are clear. Negative visualized tympanic cavities. Visualized orbit soft tissues are within normal limits.  Visualized scalp soft tissues are within normal limits. No acute osseous abnormality identified. Cerebral volume is within normal limits. There are several round 4-5 mm calcified foci scattered in the brain. No associated edema or mass effect. These appear primarily subarachnoid in location. That at the cisterna magna on series 2, image 5 might be related to the distal vertebral artery, uncertain. There is confluent peripheral hypodensity in the right cerebellum most pronounced on series 2, image 12. No associated hemorrhage or posterior fossa mass effect. Other posterior fossa gray-white matter differentiation is within normal limits. Supratentorial gray-white matter differentiation is normal. No acute intracranial hemorrhage identified. No ventriculomegaly. No other acute cortically based infarct identified. IMPRESSION: 1. 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 No hemorrhage or mass effect. 2. Largely unremarkable noncontrast CT appearance of the brain otherwise; several 4 mm calcified foci are nonspecific but might reflect neurocysticercosis if the patient has a history of travel to or from an endemic area. Electronically Signed   By: Odessa Fleming M.D.   On: 09/02/2015 22:36   Mr Angiogram Neck W Wo Contrast  09/03/2015  CLINICAL DATA:  39 year old male with acute dizziness and altered mental status. Symptoms for 6 months but worse over the past 2 weeks. Abnormal CT. Subsequent encounter. EXAM: MRI HEAD WITHOUT AND WITH  CONTRAST AND MRA HEAD WITHOUT CONTRAST AND MRI NECK WITHOUT AND WITH CONTRAST TECHNIQUE: Multiplanar, multiecho pulse sequences of the brain and surrounding structures were obtained without and with intravenous contrast. Angiographic images of the head were obtained using MRA technique without contrast. Multiplanar, multiecho pulse sequences of the neck and surrounding structures were obtained without and with intravenous contrast. CONTRAST:  15mL MULTIHANCE GADOBENATE DIMEGLUMINE  529 MG/ML IV SOLN COMPARISON:  09/02/2015 head CT.  No comparison brain MR. FINDINGS: MRI HEAD FINDINGS No acute infarct. Remote posterior inferior cerebellar infarct with encephalomalacia. No intracranial hemorrhage. Calcifications anterior medial right frontal lobe, posterior superior right parietal lobe and right medulla may reflect result of prior infection (neurocysticercosis) without surrounding vasogenic edema to suggest active inflammation. No intracranial enhancing lesion. No hydrocephalus. 2 cm polypoid opacification inferior left maxillary sinus may represent retention cyst. Mild mucosal thickening maxillary sinuses and ethmoid sinus air cells. Small pituitary gland. Cervical medullary junction, orbital structures and pineal region unremarkable. MRA HEAD FINDINGS Small irregular right vertebral artery ends in a posterior inferior cerebellar artery distribution. The full extent of the right posterior inferior cerebellar artery is not visualized. The portion which is visualized appears slightly narrowed and irregular. Large slightly ectatic left vertebral artery with minimal regularity distal aspect. Mild irregularity of the left posterior inferior cerebellar artery. Ectatic basilar artery with mild narrowing mid aspect without high-grade stenosis. Nonvisualized left anterior inferior cerebellar artery with small appearing right anterior inferior cerebellar artery. Distal aspect of the left superior cerebellar artery is not visualized. Slight irregularity of the right superior cerebellar artery. Slight irregularity distal branches posterior cerebral arteries more notable on the left. Anterior circulation without medium or large size vessel significant stenosis or occlusion. Mild narrowing at the junction of the A1/ A2 segment of the right anterior cerebral artery. Middle cerebral artery branch vessel narrowing and irregularity bilaterally. Minimal bulge undersurface M1 segment right middle cerebral artery  may represent origin of a vessel rather than aneurysm. MRI NECK FINDINGS Three vessel aortic arch. No significant stenosis of either carotid artery. Ectatic distal vertical segment of the right internal carotid artery. No significant stenosis of either subclavian artery or the left vertebral artery. Small irregular right vertebral artery with multiple areas of focal loss of signal. It may be that the right vertebral artery is congenitally small and evaluation limited by the technique however, given the patient's history, right vertebral right dissection is a possibility. Atherosclerotic type changes superimposed upon congenitally small right vertebral artery is a secondary consideration. IMPRESSION: No acute infarct. Remote posterior inferior cerebellar infarct with encephalomalacia. Small irregular right vertebral artery with multiple areas of focal loss of signal. It may be that the right vertebral artery is congenitally small and evaluation limited by the technique however, given the patient's history (right cerebellar infarct), right vertebral right dissection is a distinct possibility. Atherosclerotic type changes superimposed upon congenitally small right vertebral artery is a secondary less likely consideration. Right vertebral artery ends in a posterior inferior cerebellar artery distribution. Intracranial branch vessel irregularity as noted above. Calcifications anterior medial right frontal lobe, posterior superior right parietal lobe and right medulla may reflect result of prior infection (neurocysticercosis) without surrounding vasogenic edema to suggest active inflammation. 2 cm polypoid opacification inferior left maxillary sinus may represent retention cyst. Mild mucosal thickening maxillary sinuses and ethmoid sinus air cells. These results will be called to the ordering clinician or representative by the Radiologist Assistant, and communication documented in the PACS or zVision Dashboard.  Electronically Signed  By: Lacy DuverneySteven  Olson M.D.   On: 09/03/2015 12:12   Mr Laqueta JeanBrain W ZOWo Contrast  09/03/2015  CLINICAL DATA:  [age] year old male with acute dizziness and altered mental status. Symptoms for 6 months but worse over the past 2 weeks. Abnormal CT. Subsequent encounter. EXAM: MRI HEAD WITHOUT AND WITH CONTRAST AND MRA HEAD WITHOUT CONTRAST AND MRI NECK WITHOUT AND WITH CONTRAST TECHNIQUE: Multiplanar, multiecho pulse sequences of the brain and surrounding structures were obtained without and with intravenous contrast. Angiographic images of the head were obtained using MRA technique without contrast. Multiplanar, multiecho pulse sequences of the neck and surrounding structures were obtained without and with intravenous contrast. CONTRAST:  15mL MULTIHANCE GADOBENATE DIMEGLUMINE 529 MG/ML IV SOLN COMPARISON:  09/02/2015 head CT.  No comparison brain MR. FINDINGS: MRI HEAD FINDINGS No acute infarct. Remote posterior inferior cerebellar infarct with encephalomalacia. No intracranial hemorrhage. Calcifications anterior medial right frontal lobe, posterior superior right parietal lobe and right medulla may reflect result of prior infection (neurocysticercosis) without surrounding vasogenic edema to suggest active inflammation. No intracranial enhancing lesion. No hydrocephalus. 2 cm polypoid opacification inferior left maxillary sinus may represent retention cyst. Mild mucosal thickening maxillary sinuses and ethmoid sinus air cells. Small pituitary gland. Cervical medullary junction, orbital structures and pineal region unremarkable. MRA HEAD FINDINGS Small irregular right vertebral artery ends in a posterior inferior cerebellar artery distribution. The full extent of the right posterior inferior cerebellar artery is not visualized. The portion which is visualized appears slightly narrowed and irregular. Large slightly ectatic left vertebral artery with minimal regularity distal aspect. Mild irregularity of  the left posterior inferior cerebellar artery. Ectatic basilar artery with mild narrowing mid aspect without high-grade stenosis. Nonvisualized left anterior inferior cerebellar artery with small appearing right anterior inferior cerebellar artery. Distal aspect of the left superior cerebellar artery is not visualized. Slight irregularity of the right superior cerebellar artery. Slight irregularity distal branches posterior cerebral arteries more notable on the left. Anterior circulation without medium or large size vessel significant stenosis or occlusion. Mild narrowing at the junction of the A1/ A2 segment of the right anterior cerebral artery. Middle cerebral artery branch vessel narrowing and irregularity bilaterally. Minimal bulge undersurface M1 segment right middle cerebral artery may represent origin of a vessel rather than aneurysm. MRI NECK FINDINGS Three vessel aortic arch. No significant stenosis of either carotid artery. Ectatic distal vertical segment of the right internal carotid artery. No significant stenosis of either subclavian artery or the left vertebral artery. Small irregular right vertebral artery with multiple areas of focal loss of signal. It may be that the right vertebral artery is congenitally small and evaluation limited by the technique however, given the patient's history, right vertebral right dissection is a possibility. Atherosclerotic type changes superimposed upon congenitally small right vertebral artery is a secondary consideration. IMPRESSION: No acute infarct. Remote posterior inferior cerebellar infarct with encephalomalacia. Small irregular right vertebral artery with multiple areas of focal loss of signal. It may be that the right vertebral artery is congenitally small and evaluation limited by the technique however, given the patient's history (right cerebellar infarct), right vertebral right dissection is a distinct possibility. Atherosclerotic type changes superimposed  upon congenitally small right vertebral artery is a secondary less likely consideration. Right vertebral artery ends in a posterior inferior cerebellar artery distribution. Intracranial branch vessel irregularity as noted above. Calcifications anterior medial right frontal lobe, posterior superior right parietal lobe and right medulla may reflect result of prior infection (neurocysticercosis) without surrounding vasogenic edema to  suggest active inflammation. 2 cm polypoid opacification inferior left maxillary sinus may represent retention cyst. Mild mucosal thickening maxillary sinuses and ethmoid sinus air cells. These results will be called to the ordering clinician or representative by the Radiologist Assistant, and communication documented in the PACS or zVision Dashboard. Electronically Signed   By: Lacy Duverney M.D.   On: 09/03/2015 12:12   Mr Palma Holter  09/03/2015  CLINICAL DATA:  39 year old male with acute dizziness and altered mental status. Symptoms for 6 months but worse over the past 2 weeks. Abnormal CT. Subsequent encounter. EXAM: MRI HEAD WITHOUT AND WITH CONTRAST AND MRA HEAD WITHOUT CONTRAST AND MRI NECK WITHOUT AND WITH CONTRAST TECHNIQUE: Multiplanar, multiecho pulse sequences of the brain and surrounding structures were obtained without and with intravenous contrast. Angiographic images of the head were obtained using MRA technique without contrast. Multiplanar, multiecho pulse sequences of the neck and surrounding structures were obtained without and with intravenous contrast. CONTRAST:  15mL MULTIHANCE GADOBENATE DIMEGLUMINE 529 MG/ML IV SOLN COMPARISON:  09/02/2015 head CT.  No comparison brain MR. FINDINGS: MRI HEAD FINDINGS No acute infarct. Remote posterior inferior cerebellar infarct with encephalomalacia. No intracranial hemorrhage. Calcifications anterior medial right frontal lobe, posterior superior right parietal lobe and right medulla may reflect result of prior infection  (neurocysticercosis) without surrounding vasogenic edema to suggest active inflammation. No intracranial enhancing lesion. No hydrocephalus. 2 cm polypoid opacification inferior left maxillary sinus may represent retention cyst. Mild mucosal thickening maxillary sinuses and ethmoid sinus air cells. Small pituitary gland. Cervical medullary junction, orbital structures and pineal region unremarkable. MRA HEAD FINDINGS Small irregular right vertebral artery ends in a posterior inferior cerebellar artery distribution. The full extent of the right posterior inferior cerebellar artery is not visualized. The portion which is visualized appears slightly narrowed and irregular. Large slightly ectatic left vertebral artery with minimal regularity distal aspect. Mild irregularity of the left posterior inferior cerebellar artery. Ectatic basilar artery with mild narrowing mid aspect without high-grade stenosis. Nonvisualized left anterior inferior cerebellar artery with small appearing right anterior inferior cerebellar artery. Distal aspect of the left superior cerebellar artery is not visualized. Slight irregularity of the right superior cerebellar artery. Slight irregularity distal branches posterior cerebral arteries more notable on the left. Anterior circulation without medium or large size vessel significant stenosis or occlusion. Mild narrowing at the junction of the A1/ A2 segment of the right anterior cerebral artery. Middle cerebral artery branch vessel narrowing and irregularity bilaterally. Minimal bulge undersurface M1 segment right middle cerebral artery may represent origin of a vessel rather than aneurysm. MRI NECK FINDINGS Three vessel aortic arch. No significant stenosis of either carotid artery. Ectatic distal vertical segment of the right internal carotid artery. No significant stenosis of either subclavian artery or the left vertebral artery. Small irregular right vertebral artery with multiple areas of  focal loss of signal. It may be that the right vertebral artery is congenitally small and evaluation limited by the technique however, given the patient's history, right vertebral right dissection is a possibility. Atherosclerotic type changes superimposed upon congenitally small right vertebral artery is a secondary consideration. IMPRESSION: No acute infarct. Remote posterior inferior cerebellar infarct with encephalomalacia. Small irregular right vertebral artery with multiple areas of focal loss of signal. It may be that the right vertebral artery is congenitally small and evaluation limited by the technique however, given the patient's history (right cerebellar infarct), right vertebral right dissection is a distinct possibility. Atherosclerotic type changes superimposed upon congenitally small right vertebral  artery is a secondary less likely consideration. Right vertebral artery ends in a posterior inferior cerebellar artery distribution. Intracranial branch vessel irregularity as noted above. Calcifications anterior medial right frontal lobe, posterior superior right parietal lobe and right medulla may reflect result of prior infection (neurocysticercosis) without surrounding vasogenic edema to suggest active inflammation. 2 cm polypoid opacification inferior left maxillary sinus may represent retention cyst. Mild mucosal thickening maxillary sinuses and ethmoid sinus air cells. These results will be called to the ordering clinician or representative by the Radiologist Assistant, and communication documented in the PACS or zVision Dashboard. Electronically Signed   By: Lacy Duverney M.D.   On: 09/03/2015 12:12     Assessment/Plan:  39 y.o. male who presents with complaint of persistent dizziness with some vague intermittent mental status changes. Patient states his headache and minor symptoms having going on for some time, but have gotten acutely worse the last 2 weeks. CTH  R cerebellar infarct as  well as calcified lesions consistent with neurocysticercosis.   Pt is from [place], came to [place] about 20 yrs ago. 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 No need for steroids or anti epileptics as calcifications are not cortical.  Appreciate pharmacy assistance in obtaining the medication.    2. R cerebellar subacute stroke  - Likely in the setting of vertebral dissection. No need for anticoagulation. Full dose of ASA daily con't for at least 6 months.   3. Depression/anxieoty  - Appreciate evaluation by psychiatry, pt received mirtazapine last night and slept well.    4. D/c today  Pauletta Browns  09/04/2015, 9:54 AM

## 2015-09-04 NOTE — Care Management (Signed)
Discharge to home today. Spoke with Mr. Bill Brown at the bedside. [facility] hard [facility] [place]. This building is where the Outpatient Pharmacy is located. Encouraged to go to this pharmacy today to fill his prescription.  States he already has an appointment to see a physician on Monday at 6:00pm. [address], doesn't remember name of physician. States hat his girlfriend or friend will transport home. Mr. Bill Brown's contact number is [phone] Gwenette GreetBrenda S Jonalyn Sedlak RN MSN Care Management [phone]

## 2015-09-10 ENCOUNTER — Encounter: Payer: Self-pay | Admitting: *Deleted

## 2015-09-10 ENCOUNTER — Emergency Department: Payer: Self-pay

## 2015-09-10 ENCOUNTER — Emergency Department
Admission: EM | Admit: 2015-09-10 | Discharge: 2015-09-11 | Disposition: A | Discharge status: Home / Self Care | Payer: Self-pay | Attending: Emergency Medicine | Admitting: Emergency Medicine

## 2015-09-10 DIAGNOSIS — Z7982 Long term (current) use of aspirin: Secondary | ICD-10-CM | POA: Insufficient documentation

## 2015-09-10 DIAGNOSIS — Z8673 Personal history of transient ischemic attack (TIA), and cerebral infarction without residual deficits: Secondary | ICD-10-CM | POA: Insufficient documentation

## 2015-09-10 DIAGNOSIS — R531 Weakness: Secondary | ICD-10-CM | POA: Insufficient documentation

## 2015-09-10 DIAGNOSIS — Z79899 Other long term (current) drug therapy: Secondary | ICD-10-CM | POA: Insufficient documentation

## 2015-09-10 DIAGNOSIS — F419 Anxiety disorder, unspecified: Secondary | ICD-10-CM | POA: Insufficient documentation

## 2015-09-10 DIAGNOSIS — R51 Headache: Secondary | ICD-10-CM | POA: Insufficient documentation

## 2015-09-10 DIAGNOSIS — R0789 Other chest pain: Secondary | ICD-10-CM | POA: Insufficient documentation

## 2015-09-10 DIAGNOSIS — R42 Dizziness and giddiness: Secondary | ICD-10-CM | POA: Insufficient documentation

## 2015-09-10 DIAGNOSIS — R002 Palpitations: Secondary | ICD-10-CM | POA: Insufficient documentation

## 2015-09-10 LAB — TROPONIN I: Troponin I: <0.03 ng/mL (ref <0.031)

## 2015-09-10 LAB — BASIC METABOLIC PANEL
Anion gap: 5 (ref 5–15)
BUN: 12 mg/dL (ref 6–20)
CALCIUM: 8.9 mg/dL (ref 8.9–10.3)
CO2: 27 mmol/L (ref 22–32)
CREATININE: 0.9 mg/dL (ref 0.61–1.24)
Chloride: 106 mmol/L (ref 101–111)
GFR calc non Af Amer: >60 mL/min (ref >60)
Glucose, Bld: 124 mg/dL — ABNORMAL HIGH (ref 65–99)
Potassium: 3.8 mmol/L (ref 3.5–5.1)
SODIUM: 138 mmol/L (ref 135–145)

## 2015-09-10 LAB — CBC WITH DIFFERENTIAL/PLATELET
BASOS PCT: 0 %
Basophils Absolute: 0 10*3/uL (ref 0–0.1)
EOS ABS: 0.1 10*3/uL (ref 0–0.7)
EOS PCT: 1 %
HCT: 45 % (ref 40.0–52.0)
Hemoglobin: 15.4 g/dL (ref 13.0–18.0)
Lymphocytes Relative: 36 %
Lymphs Abs: 3.2 10*3/uL (ref 1.0–3.6)
MCH: 28.6 pg (ref 26.0–34.0)
MCHC: 34.1 g/dL (ref 32.0–36.0)
MCV: 83.6 fL (ref 80.0–100.0)
MONOS PCT: 12 %
Monocytes Absolute: 1.1 10*3/uL — ABNORMAL HIGH (ref 0.2–1.0)
Neutro Abs: 4.4 10*3/uL (ref 1.4–6.5)
Neutrophils Relative %: 51 %
Platelets: 210 10*3/uL (ref 150–440)
RBC: 5.39 MIL/uL (ref 4.40–5.90)
RDW: 13.5 % (ref 11.5–14.5)
WBC: 8.9 10*3/uL (ref 3.8–10.6)

## 2015-09-10 NOTE — ED Notes (Signed)
Pt to triage via wheelchair.  Pt reports a headache for 1 day.  Pt seen in er last week with dizziness and continues to have it.  Pt also states he just feels weak. Pt alert.  Speech clear.

## 2015-09-10 NOTE — Discharge Instructions (Signed)
You were in the hospital before due to a stroke. 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 It may take some time for your body to heal further. Follow-up with [facility]. They may wish to have you see a neurologist as well. Return to the emergency department if you have worsening chest pain, headache, weakness in one part of your body, or worsening dizziness, or other urgent concerns.  Usted estuvo en el hospital antes debido a un derrame cerebral. Esto fue llamado con una diseccin de la arteria vertebral derecha. Puede tomar algn tiempo para que su cuerpo se cure ms. El seguimiento con su mdico de cabecera en la comunidad de [place]. Ellos pueden desear tener que vea un neurlogo tambin. Volver a la sala de urgencias si tiene Engineer, miningdolor de Leonpecho, dolor de Turkmenistancabeza, debilidad en una parte de su cuerpo, mareos o empeoramiento, u otras preocupaciones urgentes.

## 2015-09-10 NOTE — ED Provider Notes (Signed)
[facility] Emergency Department Provider Note  ____________________________________________  Time seen: 2225  I have reviewed the triage vital signs and the nursing notes.   HISTORY  Chief Complaint Headache; Dizziness; and Weakness     HPI Bill Brown is a 39 y.o. 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 He was admitted to the hospital and he was discharged approximately a week ago.  He presents the emergency department tonight with ongoing concerns. He reports he has had dizziness since discharge it is ongoing. He denies any acute change.  He also expresses concern about his cholesterol. He has been started on a statin. He was told his cholesterol was quite high and he is concerned about this.  His most acute complaint appears to be some chest discomfort that he had in his left chest earlier this evening at approximately 7 PM. He reports having had 2 episodes area did each episode lasted approximately 3 minutes. He has not feeling any of those symptoms currently  The patient has [facility]. He needed to go there in part because of cost and in part, I believe, because they had been dissolved. He is also been seen by a [facility]. He has a follow-up appointment to that one in approximately 2 weeks.      Past Medical History  Diagnosis Date  . Patient denies medical problems     Patient Active Problem List   Diagnosis Date Noted  . 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 Panic attacks 09/03/2015    Past Surgical History  Procedure Laterality Date  . No past surgeries      Current Outpatient Rx  Name  Route  Sig  Dispense  Refill  . albendazole (ALBENZA) 200 MG tablet   Oral   Take 2 tablets (400 mg total) by mouth 2 (two) times daily with a meal.   16 tablet   0   . aspirin EC 325 MG EC tablet    Oral   Take 1 tablet (325 mg total) by mouth daily.   30 tablet   2   . atorvastatin (LIPITOR) 40 MG tablet   Oral   Take 1 tablet (40 mg total) by mouth daily at 6 PM.   30 tablet   2   . mirtazapine (REMERON) 15 MG tablet   Oral   Take 1 tablet (15 mg total) by mouth at bedtime.   30 tablet   2   . ranitidine (ZANTAC) 150 MG tablet   Oral   Take 1 tablet (150 mg total) by mouth 2 (two) times daily.   60 tablet   2     Allergies Review of patient's allergies indicates no known allergies.  Family History  Problem Relation Age of Onset  . Family history unknown: Yes    Social History Social History  Substance Use Topics  . Smoking status: Never Smoker   . Smokeless tobacco: None  . Alcohol Use: No    Review of Systems  Constitutional: Negative for fever. ENT: Negative for sore throat. Cardiovascular: Palpitations, chest discomfort, lasted 3 minutes. See history of present illness Respiratory: Negative for cough. Gastrointestinal: Negative for abdominal pain, vomiting and diarrhea. Genitourinary: Negative for dysuria. Musculoskeletal: No myalgias or injuries. Skin: Negative for rash. Neurological: Recent CVA. Ongoing dizziness.   10-point ROS otherwise negative.  ____________________________________________   PHYSICAL EXAM:  VITAL SIGNS:  ED Triage Vitals  Enc Vitals Group     BP 09/10/15 2146 114/71 mmHg     Pulse Rate 09/10/15 2146 97     Resp 09/10/15 2146 20     Temp 09/10/15 2146 98.6 F (37 C)     Temp Source 09/10/15 2146 Oral     SpO2 09/10/15 2146 99 %     Weight 09/10/15 2146 160 lb (72.576 kg)     Height 09/10/15 2146  (1.676 m)     Head Cir --      Peak Flow --      Pain Score 09/10/15 2147 6     Pain Loc --      Pain Edu? --      Excl. in GC? --     Constitutional:  Alert and oriented. Well appearing and in no distress. ENT   Head: Normocephalic and atraumatic.   Nose: No congestion/rhinnorhea.     Cardiovascular: Normal rate, regular rhythm, no murmur noted Respiratory:  Normal respiratory effort, no tachypnea.    Breath sounds are clear and equal bilaterally.  Gastrointestinal: Soft and nontender. No distention.  Back: No muscle spasm, no tenderness, no CVA tenderness. Musculoskeletal: No deformity noted. Nontender with normal range of motion in all extremities.  No noted edema. Neurologic:  Normal speech and language. Equal grip strength bilaterally. Negative pronator drift. Negative Romberg. Good finger to nose motion. 5 over 5 strength in all 4 extremities. Sensation intact in all 4 extremities. Cranial nerves II through XII are intact. No gross focal neurologic deficits are appreciated.  Skin:  Skin is warm, dry. No rash noted. Psychiatric: Mood and affect are normal. Speech and behavior are normal.  ____________________________________________    LABS (pertinent positives/negatives)  Labs Reviewed  TROPONIN I  CBC WITH DIFFERENTIAL/PLATELET  BASIC METABOLIC PANEL     ____________________________________________   EKG  ED ECG REPORT I, Xitlalic Maslin W, the attending physician, personally viewed and interpreted this ECG.   Date: 09/10/2015  EKG Time: 2152  Rate: 75  Rhythm: Normal sinus rhythm  Axis: Normal  Intervals: Normal  ST&T Change: None noted  ____________________________________________    RADIOLOGY  Chest x-ray:  ____________________________________________   PROCEDURES  ____________________________________________   INITIAL IMPRESSION / ASSESSMENT AND PLAN / ED COURSE  Pertinent labs & imaging results that were available during my care of the patient were reviewed by me and considered in my medical decision making (see chart for details).  Well-appearing 39 year old male in no acute distress, though he is anxious about his medical condition, which is understandable given his recent stroke. His dizziness is ongoing with no acute change.  The most acute aspect of his concerns tonight is the left-sided chest pain. This was very brief, proximally 3 minutes in duration 2. He has having no discomfort now. My suspicion for cardiac disease is low, but we will check an EKG, troponin, and obtain a chest x-ray. If these are okay, we will release the patient home with instructions to follow with his primary physician and possibly a neurologist for his ongoing dizziness.  ----------------------------------------- 11:16 PM on 09/10/2015 -----------------------------------------  Chest x-ray and labs are still pending. I will last my colleague, Dr. Lenard Lance, to check on the results and the seem care of the patient this time.  ____________________________________________   FINAL CLINICAL IMPRESSION(S) / ED DIAGNOSES  Final diagnoses:  Dizziness  Chest discomfort      Darien Ramus, MD 09/10/15 2316

## 2015-09-10 NOTE — ED Notes (Signed)
Pt presents to ED with c/o dizziness and weakness for the last week. Pt reports being seen here recently and told he "had small blood on his brain". Pt reports being prescribed medication, but fails to feel better. Pt is A&O, in NAD, with s/o at bedside.

## 2015-09-11 NOTE — ED Provider Notes (Signed)
-----------------------------------------   12:01 AM on 09/11/2015 -----------------------------------------  Labs are largely within normal limits. Chest x-ray negative. I discussed the findings with the patient, he states all of his discomfort has resolved. We will discharge the patient home with neurology follow-up for his continued dizziness. Denies any worsening with dizziness, but states it does continue intermittently. Likely related to his recent cerebellar infarct.  Minna AntisKevin Guillermo Nehring, MD 09/11/15 0002

## 2015-10-21 ENCOUNTER — Emergency Department
Admission: EM | Admit: 2015-10-21 | Discharge: 2015-10-22 | Disposition: A | Discharge status: Home / Self Care | Payer: Self-pay | Attending: Emergency Medicine | Admitting: Emergency Medicine

## 2015-10-21 ENCOUNTER — Emergency Department: Payer: Self-pay

## 2015-10-21 ENCOUNTER — Encounter: Payer: Self-pay | Admitting: Emergency Medicine

## 2015-10-21 DIAGNOSIS — R519 Headache, unspecified: Secondary | ICD-10-CM

## 2015-10-21 DIAGNOSIS — R202 Paresthesia of skin: Secondary | ICD-10-CM | POA: Insufficient documentation

## 2015-10-21 DIAGNOSIS — R51 Headache: Secondary | ICD-10-CM | POA: Insufficient documentation

## 2015-10-21 DIAGNOSIS — R42 Dizziness and giddiness: Secondary | ICD-10-CM | POA: Insufficient documentation

## 2015-10-21 DIAGNOSIS — Z79899 Other long term (current) drug therapy: Secondary | ICD-10-CM | POA: Insufficient documentation

## 2015-10-21 DIAGNOSIS — Z7982 Long term (current) use of aspirin: Secondary | ICD-10-CM | POA: Insufficient documentation

## 2015-10-21 HISTORY — DX: Cerebral infarction, unspecified: I63.9

## 2015-10-21 LAB — URINE DRUG SCREEN, QUALITATIVE (ARMC ONLY)
Amphetamines, Ur Screen: NOT DETECTED
BENZODIAZEPINE, UR SCRN: NOT DETECTED
Barbiturates, Ur Screen: NOT DETECTED
CANNABINOID 50 NG, UR ~~LOC~~: NOT DETECTED
Cocaine Metabolite,Ur ~~LOC~~: NOT DETECTED
MDMA (ECSTASY) UR SCREEN: NOT DETECTED
Methadone Scn, Ur: NOT DETECTED
Opiate, Ur Screen: NOT DETECTED
PHENCYCLIDINE (PCP) UR S: NOT DETECTED
Tricyclic, Ur Screen: NOT DETECTED

## 2015-10-21 LAB — TROPONIN I

## 2015-10-21 LAB — URINALYSIS COMPLETE WITH MICROSCOPIC (ARMC ONLY)
Bacteria, UA: NONE SEEN
Bilirubin Urine: NEGATIVE
GLUCOSE, UA: NEGATIVE mg/dL
Hgb urine dipstick: NEGATIVE
Ketones, ur: NEGATIVE mg/dL
Leukocytes, UA: NEGATIVE
Nitrite: NEGATIVE
Protein, ur: NEGATIVE mg/dL
Specific Gravity, Urine: 1.005 (ref 1.005–1.030)
Squamous Epithelial / LPF: NONE SEEN
pH: 6 (ref 5.0–8.0)

## 2015-10-21 LAB — COMPREHENSIVE METABOLIC PANEL
ALBUMIN: 4.3 g/dL (ref 3.5–5.0)
ALK PHOS: 48 U/L (ref 38–126)
ALT: 59 U/L (ref 17–63)
AST: 38 U/L (ref 15–41)
Anion gap: 7 (ref 5–15)
BILIRUBIN TOTAL: 0.8 mg/dL (ref 0.3–1.2)
BUN: 12 mg/dL (ref 6–20)
CALCIUM: 9.2 mg/dL (ref 8.9–10.3)
CO2: 25 mmol/L (ref 22–32)
CREATININE: 0.86 mg/dL (ref 0.61–1.24)
Chloride: 102 mmol/L (ref 101–111)
GFR calc Af Amer: >60 mL/min (ref >60)
GFR calc non Af Amer: >60 mL/min (ref >60)
GLUCOSE: 105 mg/dL — AB (ref 65–99)
Potassium: 3.4 mmol/L — ABNORMAL LOW (ref 3.5–5.1)
Sodium: 134 mmol/L — ABNORMAL LOW (ref 135–145)
TOTAL PROTEIN: 7.5 g/dL (ref 6.5–8.1)

## 2015-10-21 LAB — DIFFERENTIAL
BASOS ABS: 0.1 10*3/uL (ref 0–0.1)
Basophils Relative: 1 %
Eosinophils Absolute: 0.1 10*3/uL (ref 0–0.7)
Eosinophils Relative: 2 %
LYMPHS ABS: 3.6 10*3/uL (ref 1.0–3.6)
LYMPHS PCT: 36 %
Monocytes Absolute: 0.8 10*3/uL (ref 0.2–1.0)
Monocytes Relative: 8 %
NEUTROS PCT: 53 %
Neutro Abs: 5.3 10*3/uL (ref 1.4–6.5)

## 2015-10-21 LAB — CBC
HEMATOCRIT: 45.6 % (ref 40.0–52.0)
HEMOGLOBIN: 16 g/dL (ref 13.0–18.0)
MCH: 29.1 pg (ref 26.0–34.0)
MCHC: 35 g/dL (ref 32.0–36.0)
MCV: 83.1 fL (ref 80.0–100.0)
Platelets: 237 10*3/uL (ref 150–440)
RBC: 5.49 MIL/uL (ref 4.40–5.90)
RDW: 13.7 % (ref 11.5–14.5)
WBC: 9.9 10*3/uL (ref 3.8–10.6)

## 2015-10-21 LAB — PROTIME-INR
INR: 1.01
Prothrombin Time: 13.5 seconds (ref 11.4–15.0)

## 2015-10-21 LAB — APTT: APTT: 28 s (ref 24–36)

## 2015-10-21 NOTE — ED Provider Notes (Signed)
[facility] Emergency Department Provider Note  Time seen: 11:15 PM  I have reviewed the triage vital signs and the nursing notes.   HISTORY  Chief Complaint Dizziness and Code Stroke    HPI Bill Brown is a 39 y.o. male with a past medical history of CVA, who presents the emergency department for dizziness and a tingling sensation in his head. According to the patient he was diagnosed with a stroke approximately 1 month ago. At that time the patient was admitted to the hospital with what appears to be a cerebellar stroke with encephalomalacia. Patient states since that admission he has continued to feel dizzy at times, with occasional headaches. He states today he felt more dizzy than normal, and he was experiencing tingling feelings all over his head, which he states he normally only feels on the right side of his head. Patient states he came because he is "scared" that he will have another stroke. Denies any weakness or numbness. Denies any confusion, slurred speech.Denies any fever.     Past Medical History  Diagnosis Date  . Patient denies medical problems   . Stroke [facility])     Patient Active Problem List   Diagnosis Date Noted  . 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 Panic attacks 09/03/2015    Past Surgical History  Procedure Laterality Date  . No past surgeries      Current Outpatient Rx  Name  Route  Sig  Dispense  Refill  . albendazole (ALBENZA) 200 MG tablet   Oral   Take 2 tablets (400 mg total) by mouth 2 (two) times daily with a meal.   16 tablet   0   . aspirin EC 325 MG EC tablet   Oral   Take 1 tablet (325 mg total) by mouth daily.   30 tablet   2   . atorvastatin (LIPITOR) 40 MG tablet   Oral   Take 1 tablet (40 mg total) by mouth daily at 6 PM.   30 tablet   2   . mirtazapine (REMERON) 15 MG tablet   Oral   Take 1 tablet (15 mg total) by mouth at bedtime.   30 tablet   2   .  ranitidine (ZANTAC) 150 MG tablet   Oral   Take 1 tablet (150 mg total) by mouth 2 (two) times daily.   60 tablet   2     Allergies Review of patient's allergies indicates no known allergies.  Family History  Problem Relation Age of Onset  . Family history unknown: Yes    Social History Social History  Substance Use Topics  . Smoking status: Never Smoker   . Smokeless tobacco: None  . Alcohol Use: No    Review of Systems Constitutional: Negative for fever. Cardiovascular: Negative for chest pain. Respiratory: Negative for shortness of breath. Gastrointestinal: Negative for abdominal pain, vomiting and diarrhea. Genitourinary: Negative for dysuria. Musculoskeletal: Negative for back pain. Neurological: Mild headache, states this feels typical. Denies focal weakness or numbness. 10-point ROS otherwise negative.  ____________________________________________   PHYSICAL EXAM:  VITAL SIGNS: Bill Triage Vitals  Enc Vitals Group     BP 10/21/15 2249 110/85 mmHg     Pulse Rate 10/21/15 2249 63     Resp 10/21/15 2249 18     Temp 10/21/15 2249 97.8 F (36.6 C)     Temp Source 10/21/15 2249 Oral     SpO2 10/21/15 2249 99 %  Weight 10/21/15 2249 161 lb (73.029 kg)     Height 10/21/15 2249 5\' 3"  (1.6 m)     Head Cir --      Peak Flow --      Pain Score 10/21/15 2250 4     Pain Loc --      Pain Edu? --      Excl. in GC? --     Constitutional: Alert and oriented. Well appearing and in no distress. Eyes: Normal exam ENT   Head: Normocephalic and atraumatic.   Mouth/Throat: Mucous membranes are moist. Cardiovascular: Normal rate, regular rhythm. No murmur Respiratory: Normal respiratory effort without tachypnea nor retractions. Breath sounds are clear and equal bilaterally. No wheezes/rales/rhonchi. Gastrointestinal: Soft and nontender. No distention.   Musculoskeletal: Nontender with normal range of motion in all extremities. Neurologic:  Normal speech and  language. No gross focal neurologic deficits are appreciated. Speech is normal. Skin:  Skin is warm, dry and intact.  Psychiatric: Mood and affect are normal. Speech and behavior are normal.   ____________________________________________  RADIOLOGY  Stable CT, unchanged.  ____________________________________________    INITIAL IMPRESSION / ASSESSMENT AND PLAN / Bill COURSE  Pertinent labs & imaging results that were available during my care of the patient were reviewed by me and considered in my medical decision making (see chart for details).  Patient presents to the emergency department with diffuse tingling sensation in his head, which is now gone. Also somewhat worse dizziness today although he states he feels dizzy every day. Denies any weakness or numbness. Has a good neurologic exam. Normal physical exam. We will check CT head, labs, and closely monitor in the emergency department. In reviewing the patient's chart, it appears he recently had a significant workup including MRI showing cerebellar infarct with encephalomalacia, and several areas of calcification which could be consistent with neurocysticercosis.   CT stable, unchanged. Labs are within normal limits. Patient states he feels much better. He is reassured and is ready to go home per patient. Discussed with the patient need to follow up with neurology, patient is agreeable.  ____________________________________________   FINAL CLINICAL IMPRESSION(S) / Bill DIAGNOSES  Dizziness Headache   Minna AntisKevin Conny Moening, MD 10/22/15 [phone]

## 2015-10-21 NOTE — ED Notes (Signed)
Pt arrived to the ED for complaints of headache, dizziness and a weird feeling in his head. Pt reports that he was diagnosed with a stroke approximately 1 moth ago and admitted to the hospital. Pt reports having headache unrelieved by over the counter headache medication. Pt is AOx4 in no apparent distress.

## 2015-10-22 LAB — ETHANOL: Alcohol, Ethyl (B): <5 mg/dL (ref <5)

## 2015-10-22 NOTE — Discharge Instructions (Signed)
You have been seen in the emergency department for dizziness and a headache. This is likely due to-year-old stroke, your workup today is not showing any acute or new changes. Please follow-up with neurology for calling the number provided. Return to the emergency department for any increased headache, slurred speech, confusion, weakness or numbness of any arm or leg, or any other symptoms you've fine personally concerning.    Mareos (Dizziness) Los mareos son un problema muy frecuente. Se trata de una sensacin de inestabilidad o de desvanecimiento. Puede sentir que se va a desmayar. Un mareo puede provocarle una lesin si se tropieza o se cae. Las Dealerpersonas de todas las edades pueden sufrir Research scientist (life sciences)mareos, Biomedical engineerpero es ms frecuente en los adultos Park Centermayores. Esta afeccin puede tener muchas causas, entre las que se pueden Assurantmencionar los medicamentos, la deshidratacin y Boqueronlas enfermedades. INSTRUCCIONES PARA EL CUIDADO EN EL HOGAR Estas indicaciones pueden ayudarlo con el trastorno: Comida y bebida  Beba suficiente lquido para Pharmacologistmantener la orina clara o de color amarillo plido. Esto evita la deshidratacin. Trate de beber ms lquidos transparentes, como agua.  No beba alcohol.  Limite el consumo de cafena si el mdico se lo indica.  Limite el consumo de sal si el mdico se lo indica. Actividad  Evite los movimientos rpidos.  Levntese de las sillas con lentitud y apyese hasta sentirse bien.  Por la maana, sintese primero a un lado de la cama. Cuando se sienta bien, pngase lentamente de [address] mientras se sostiene de algo, hasta que sepa que ha logrado el equilibrio.  Mueva las piernas con frecuencia si debe estar de pie en un lugar durante mucho tiempo. Mientras est de pie, contraiga y relaje los msculos de las piernas.  No conduzca vehculos ni opere maquinaria pesada si se siente mareado.  Evite agacharse si se siente mareado. En su casa, coloque los objetos de modo que le resulte fcil  alcanzarlos sin Public librarianagacharse. Estilo de vida  No consuma ningn producto que contenga tabaco, lo que incluye cigarrillos, tabaco de Theatre managermascar o Administrator, Civil Servicecigarrillos electrnicos. Si necesita ayuda para dejar de fumar, consulte al American Expressmdico.  Trate de reducir el nivel de estrs practicando actividades como el yoga o la meditacin. Hable con el mdico si necesita ayuda. Instrucciones generales  Controle sus mareos para ver si hay cambios.  Tome los medicamentos solamente como se lo haya indicado el mdico. Hable con el mdico si cree que los medicamentos que est tomando son la causa de sus mareos.  Infrmele a un amigo o a un familiar si se siente mareado. Pdale a esta persona que llame al mdico si observa cambios en su comportamiento.  Concurra a todas las visitas de control como se lo haya indicado el mdico. Esto es importante. SOLICITE ATENCIN MDICA SI:  Los American Expressmareos persisten.  Los Golden West Financialmareos o la sensacin de Production assistant, radiodesvanecimiento empeoran.  Siente nuseas.  Ha perdido la audicin.  Aparecen nuevos sntomas.  Cuando est de pie se siente inestable o que la habitacin da vueltas. SOLICITE ATENCIN MDICA DE INMEDIATO SI:  Vomita o tiene diarrea y no puede comer ni beber nada.  Tiene dificultad para hablar, para caminar, para tragar o para Boeingusar los brazos, las manos o las piernas.  Siente una debilidad generalizada.  No piensa con claridad o tiene dificultades para armar oraciones. Es posible que un amigo o un familiar adviertan que esto ocurre.  Tiene dolor de pecho, dolor abdominal, sudoracin o Company secretaryle falta el aire.  Hay cambios en la visin.  Observa un  sangrado.  Tiene dolores de [place].  Tiene dolor o rigidez en el cuello.  Tiene fiebre.   Esta informacin no tiene Theme park manager el consejo del mdico. Asegrese de hacerle al mdico cualquier pregunta que tenga.   Document Released: 11/07/2005 Document Revised: 03/24/2015 Elsevier Interactive Patient Education [employer].

## 2016-03-04 ENCOUNTER — Encounter: Payer: Self-pay | Admitting: Emergency Medicine

## 2016-03-04 ENCOUNTER — Emergency Department
Admission: EM | Admit: 2016-03-04 | Discharge: 2016-03-04 | Disposition: A | Discharge status: Home / Self Care | Payer: Self-pay | Attending: Emergency Medicine | Admitting: Emergency Medicine

## 2016-03-04 ENCOUNTER — Emergency Department: Payer: Self-pay

## 2016-03-04 DIAGNOSIS — Z7982 Long term (current) use of aspirin: Secondary | ICD-10-CM | POA: Insufficient documentation

## 2016-03-04 DIAGNOSIS — J101 Influenza due to other identified influenza virus with other respiratory manifestations: Secondary | ICD-10-CM

## 2016-03-04 DIAGNOSIS — Z79899 Other long term (current) drug therapy: Secondary | ICD-10-CM | POA: Insufficient documentation

## 2016-03-04 DIAGNOSIS — Z8673 Personal history of transient ischemic attack (TIA), and cerebral infarction without residual deficits: Secondary | ICD-10-CM | POA: Insufficient documentation

## 2016-03-04 DIAGNOSIS — J09X2 Influenza due to identified novel influenza A virus with other respiratory manifestations: Secondary | ICD-10-CM | POA: Insufficient documentation

## 2016-03-04 LAB — BASIC METABOLIC PANEL
ANION GAP: 6 (ref 5–15)
BUN: 16 mg/dL (ref 6–20)
CALCIUM: 8.4 mg/dL — AB (ref 8.9–10.3)
CO2: 27 mmol/L (ref 22–32)
Chloride: 100 mmol/L — ABNORMAL LOW (ref 101–111)
Creatinine, Ser: 0.96 mg/dL (ref 0.61–1.24)
GLUCOSE: 104 mg/dL — AB (ref 65–99)
POTASSIUM: 3.4 mmol/L — AB (ref 3.5–5.1)
Sodium: 133 mmol/L — ABNORMAL LOW (ref 135–145)

## 2016-03-04 LAB — CBC WITH DIFFERENTIAL/PLATELET
BASOS ABS: 0 10*3/uL (ref 0–0.1)
BASOS PCT: 0 %
EOS PCT: 0 %
Eosinophils Absolute: 0 10*3/uL (ref 0–0.7)
HEMATOCRIT: 44 % (ref 40.0–52.0)
Hemoglobin: 15.2 g/dL (ref 13.0–18.0)
LYMPHS PCT: 14 %
Lymphs Abs: 1.4 10*3/uL (ref 1.0–3.6)
MCH: 29.1 pg (ref 26.0–34.0)
MCHC: 34.6 g/dL (ref 32.0–36.0)
MCV: 84.2 fL (ref 80.0–100.0)
MONO ABS: 1 10*3/uL (ref 0.2–1.0)
Monocytes Relative: 10 %
NEUTROS ABS: 7.9 10*3/uL — AB (ref 1.4–6.5)
Neutrophils Relative %: 76 %
PLATELETS: 181 10*3/uL (ref 150–440)
RBC: 5.23 MIL/uL (ref 4.40–5.90)
RDW: 13.9 % (ref 11.5–14.5)
WBC: 10.4 10*3/uL (ref 3.8–10.6)

## 2016-03-04 LAB — RAPID INFLUENZA A&B ANTIGENS (ARMC ONLY): INFLUENZA B (ARMC): NEGATIVE

## 2016-03-04 LAB — APTT: aPTT: 33 seconds (ref 24–36)

## 2016-03-04 LAB — PROTIME-INR
INR: 1.11
PROTHROMBIN TIME: 14.5 s (ref 11.4–15.0)

## 2016-03-04 LAB — RAPID INFLUENZA A&B ANTIGENS: Influenza A (ARMC): POSITIVE — AB

## 2016-03-04 MED ORDER — SODIUM CHLORIDE 0.9 % IV BOLUS (SEPSIS)
1000.0000 mL | Freq: Once | INTRAVENOUS | Status: AC
  Administered 2016-03-04: 1000 mL via INTRAVENOUS

## 2016-03-04 MED ORDER — IOPAMIDOL (ISOVUE-370) INJECTION 76%
100.0000 mL | Freq: Once | INTRAVENOUS | Status: AC | PRN
  Administered 2016-03-04: 100 mL via INTRAVENOUS

## 2016-03-04 MED ORDER — ONDANSETRON 4 MG PO TBDP: 4.0000 mg | ORAL_TABLET | Freq: Four times a day (QID) | ORAL | Status: AC | PRN

## 2016-03-04 MED ORDER — METOCLOPRAMIDE HCL 5 MG/ML IJ SOLN
10.0000 mg | Freq: Once | INTRAMUSCULAR | Status: AC
  Administered 2016-03-04: 10 mg via INTRAVENOUS
  Filled 2016-03-04: qty 2

## 2016-03-04 NOTE — ED Provider Notes (Signed)
[facility] Emergency Department Provider Note  ____________________________________________  Time seen: Approximately 7:36 AM  I have reviewed the triage vital signs and the nursing notes.   HISTORY  Chief Complaint Headache  Did offer the patient Spanish interpreter, reports he does not wish for one.  HPI Bill Brown is a 40 y.o. male presents for evaluation of a headache since Wednesday.  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  Patient reports that since Wednesday is been feeling a pounding bifrontal, pointing to the front of his head, headache. Made worse by standing and he feels associated fatigue. He denies any neck pain or neck stiffness. He has not noticed a fever at home but reports he's had "flu symptoms" including a runny nose, cough and mild congestion and fatigue with body aches.  He's been taking Tylenol up to 4 times a day as needed for headache and not receiving relief.   Past Medical History  Diagnosis Date  . Patient denies medical problems   . Stroke [facility])     Patient Active Problem List   Diagnosis Date Noted  . 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 Panic attacks 09/03/2015    Past Surgical History  Procedure Laterality Date  . No past surgeries      Current Outpatient Rx  Name  Route  Sig  Dispense  Refill  . albendazole (ALBENZA) 200 MG tablet   Oral   Take 2 tablets (400 mg total) by mouth 2 (two) times daily with a meal.   16 tablet   0   . aspirin EC 325 MG EC tablet   Oral   Take 1 tablet (325 mg total) by mouth daily.   30 tablet   2   . atorvastatin (LIPITOR) 40 MG tablet   Oral   Take 1 tablet (40 mg total) by mouth daily at 6 PM.   30 tablet   2   . mirtazapine (REMERON) 15 MG tablet   Oral   Take 1 tablet (15 mg total) by mouth at bedtime.   30 tablet   2   . ondansetron (ZOFRAN ODT) 4 MG  disintegrating tablet   Oral   Take 1 tablet (4 mg total) by mouth every 6 (six) hours as needed for nausea or vomiting.   20 tablet   0   . ranitidine (ZANTAC) 150 MG tablet   Oral   Take 1 tablet (150 mg total) by mouth 2 (two) times daily.   60 tablet   2     Allergies Review of patient's allergies indicates no known allergies.  Family History  Problem Relation Age of Onset  . Family history unknown: Yes    Social History Social History  Substance Use Topics  . Smoking status: Never Smoker   . Smokeless tobacco: None  . Alcohol Use: No    Review of Systems Constitutional: No feverHas been having chills for 3 days, fatigue and some body aches Eyes: No visual changes. Light does not make his headache worse. ENT: No sore throat. Cardiovascular: Denies chest pain. Respiratory: Denies shortness of breath. Occasional nonproductive cough. Gastrointestinal: No abdominal pain.  No nausea, no vomiting.  No diarrhea.  No constipation. Genitourinary: Negative for dysuria. Musculoskeletal: Negative for back pain except for the muscles feel very "achy" throughout his back and lower legs. Skin: Negative for rash. Neurological: Negative for focal weakness or numbness.  10-point ROS otherwise  negative.  ____________________________________________   PHYSICAL EXAM:  VITAL SIGNS: ED Triage Vitals  Enc Vitals Group     BP 03/04/16 0704 119/79 mmHg     Pulse Rate 03/04/16 0704 96     Resp 03/04/16 0704 16     Temp 03/04/16 0704 99.8 F (37.7 C)     Temp Source 03/04/16 0704 Oral     SpO2 03/04/16 0704 99 %     Weight 03/04/16 0704 155 lb (70.308 kg)     Height 03/04/16 0704 5\' 3"  (1.6 m)     Head Cir --      Peak Flow --      Pain Score 03/04/16 0705 7     Pain Loc --      Pain Edu? --      Excl. in GC? --    Constitutional: Alert and oriented. Well appearing and in no acute distress. Eyes: Conjunctivae are normal. PERRL. EOMI. Head: Atraumatic. Nose: No  congestion/rhinnorhea. Mouth/Throat: Mucous membranes are moist.  Oropharynx non-erythematous. Neck: No stridor.  No meningismus. No pain with full range of motion of the neck. Cardiovascular: Normal rate, regular rhythm. Grossly normal heart sounds.  Good peripheral circulation. Respiratory: Normal respiratory effort.  No retractions. Lungs CTAB. Occasional dry cough. Speaks in full sentences. Gastrointestinal: Soft and nontender. No distention. No CVA tenderness. Musculoskeletal: No lower extremity tenderness nor edema.  No joint effusions. Neurologic:  Normal speech and language. No gross focal neurologic deficits are appreciated. Normal cranial nerve exam. No pronator drift. Moves all extremities 5 out of 5 strength with no sensory or motor deficits noted. Skin:  Skin is warm, dry and intact. No rash noted. Psychiatric: Mood and affect are normal. Speech and behavior are normal.  ____________________________________________   LABS (all labs ordered are listed, but only abnormal results are displayed)  Labs Reviewed  RAPID INFLUENZA A&B ANTIGENS ([facility] ONLY) - Abnormal; Notable for the following:    Influenza A ([facility]) POSITIVE (*)    All other components within normal limits  CBC WITH DIFFERENTIAL/PLATELET - Abnormal; Notable for the following:    Neutro Abs 7.9 (*)    All other components within normal limits  BASIC METABOLIC PANEL - Abnormal; Notable for the following:    Sodium 133 (*)    Potassium 3.4 (*)    Chloride 100 (*)    Glucose, Bld 104 (*)    Calcium 8.4 (*)    All other components within normal limits  PROTIME-INR  APTT   ____________________________________________  EKG   ____________________________________________  RADIOLOGY  CT Angio Head W/Cm &/Or Wo Cm (Final result) Result time: 03/04/16 09:56:15   Final result by Rad Results In Interface (03/04/16 09:56:15)   Narrative:   CLINICAL DATA: Recent flu-like symptoms. No with bilateral headaches and  pain to the top of the head. Personal history of carotid dissection.  EXAM: CT ANGIOGRAPHY HEAD AND NECK  TECHNIQUE: Multidetector CT imaging of the head and neck was performed using the standard protocol during bolus administration of intravenous contrast. Multiplanar CT image reconstructions and MIPs were obtained to evaluate the vascular anatomy. Carotid stenosis measurements (when applicable) are obtained utilizing NASCET criteria, using the distal internal carotid diameter as the denominator.  CONTRAST: 80 mL Isovue 370  COMPARISON: MRI and MRA head 09/03/2015. CT of the head 10/21/2015.  FINDINGS: CT HEAD  Brain: Remote cerebellar infarcts are again noted. No acute infarct, hemorrhage, or mass lesion is present. The ventricles are of normal size. No significant extra-axial  fluid collection is present.  Calvarium and skull base: Within normal limits.  Paranasal sinuses: Neck chronic polyp or mucous retention cyst is again noted in the left maxillary sinus. The remaining paranasal sinuses and the mastoid air cells are clear.  Orbits: Within normal limits.  CTA NECK  Aortic arch: A 3 vessel arch configuration is present. No focal calcification or stenosis is evident.  Right carotid system: The right common carotid artery is within normal limits. The bifurcation is unremarkable. There is moderate tortuosity of the distal cervical right ICA without focal stenosis.  Left carotid system: The left common carotid artery is within normal limits. The bifurcation is unremarkable. The cervical left ICA demonstrates mild proximal tortuosity without focal stenosis.  Vertebral arteries:The a vertebral arteries originate from the subclavian arteries bilaterally. The right vertebral artery is hypoplastic. 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 There is no focal stenosis or vascular injury to either vertebral artery in the neck.  Skeleton:  Vertebral body heights alignment are normal. No focal stenosis is evident. No focal lytic or blastic lesions are present.  Other neck: The soft tissues of the neck are unremarkable. No focal mucosal or submucosal lesions are present. There is no significant adenopathy. The salivary glands are within normal limits bilaterally. The thyroid is unremarkable. The lung apices are clear.  CTA HEAD  Anterior circulation: The internal carotid arteries are within normal limits from the high cervical segments through the ICA termini bilaterally. The A1 and M1 segments are normal. The anterior communicating artery is patent. The MCA bifurcations are intact. ACA and MCA branch vessels are within normal limits bilaterally.  Posterior circulation: The hypoplastic right vertebral artery terminates at the PICA. A remote dissection was entertained on the previous study. Given the hypoplastic nature the vessel throughout the neck, this is unlikely. The left PICA origin is visualized and normal. The basilar artery is within normal limits. Both posterior cerebral arteries originate from the basilar tip. The PCA branch vessels are within normal limits.  Venous sinuses: The a dural sinuses are patent. The right transverse sinus is dominant. The straight sinus and internal cerebral veins are patent. The cortical veins are intact.  Anatomic variants: None  Delayed phase: The postcontrast images demonstrate no pathologic enhancement.  IMPRESSION: 1. No acute intracranial abnormality. 2. Stable remote cerebellar infarcts bilaterally, more prominent on the right. 3. Tortuosity of the cervical internal carotid arteries bilaterally is premature for age. This is nonspecific, but most commonly seen in the setting of hypertension. There is no associated stenosis. 4. 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   Electronically Signed By: Marin Robertshristopher Mattern  M.D. On: 03/04/2016 09:56          CT Angio Neck W/Cm &/Or Wo/Cm (Final result) Result time: 03/04/16 09:56:15   Final result by Rad Results In Interface (03/04/16 09:56:15)   Narrative:   CLINICAL DATA: Recent flu-like symptoms. No with bilateral headaches and pain to the top of the head. Personal history of carotid dissection.  EXAM: CT ANGIOGRAPHY HEAD AND NECK  TECHNIQUE: Multidetector CT imaging of the head and neck was performed using the standard protocol during bolus administration of intravenous contrast. Multiplanar CT image reconstructions and MIPs were obtained to evaluate the vascular anatomy. Carotid stenosis measurements (when applicable) are obtained utilizing NASCET criteria, using the distal internal carotid diameter as the denominator.  CONTRAST: 80 mL Isovue 370  COMPARISON: MRI and MRA head 09/03/2015. CT of the head 10/21/2015.  FINDINGS: CT HEAD  Brain: Remote cerebellar infarcts are again noted. No acute infarct, hemorrhage, or mass lesion is present. The ventricles are of normal size. No significant extra-axial fluid collection is present.  Calvarium and skull base: Within normal limits.  Paranasal sinuses: Neck chronic polyp or mucous retention cyst is again noted in the left maxillary sinus. The remaining paranasal sinuses and the mastoid air cells are clear.  Orbits: Within normal limits.  CTA NECK  Aortic arch: A 3 vessel arch configuration is present. No focal calcification or stenosis is evident.  Right carotid system: The right common carotid artery is within normal limits. The bifurcation is unremarkable. There is moderate tortuosity of the distal cervical right ICA without focal stenosis.  Left carotid system: The left common carotid artery is within normal limits. The bifurcation is unremarkable. The cervical left ICA demonstrates mild proximal tortuosity without focal stenosis.  Vertebral arteries:The a vertebral  arteries originate from the subclavian arteries bilaterally. The right vertebral artery is hypoplastic. 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 There is no focal stenosis or vascular injury to either vertebral artery in the neck.  Skeleton: Vertebral body heights alignment are normal. No focal stenosis is evident. No focal lytic or blastic lesions are present.  Other neck: The soft tissues of the neck are unremarkable. No focal mucosal or submucosal lesions are present. There is no significant adenopathy. The salivary glands are within normal limits bilaterally. The thyroid is unremarkable. The lung apices are clear.  CTA HEAD  Anterior circulation: The internal carotid arteries are within normal limits from the high cervical segments through the ICA termini bilaterally. The A1 and M1 segments are normal. The anterior communicating artery is patent. The MCA bifurcations are intact. ACA and MCA branch vessels are within normal limits bilaterally.  Posterior circulation: The hypoplastic right vertebral artery terminates at the PICA. A remote dissection was entertained on the previous study. Given the hypoplastic nature the vessel throughout the neck, this is unlikely. The left PICA origin is visualized and normal. The basilar artery is within normal limits. Both posterior cerebral arteries originate from the basilar tip. The PCA branch vessels are within normal limits.  Venous sinuses: The a dural sinuses are patent. The right transverse sinus is dominant. The straight sinus and internal cerebral veins are patent. The cortical veins are intact.  Anatomic variants: None  Delayed phase: The postcontrast images demonstrate no pathologic enhancement.  IMPRESSION: 1. No acute intracranial abnormality. 2. Stable remote cerebellar infarcts bilaterally, more prominent on the right. 3. Tortuosity of the cervical internal carotid arteries  bilaterally is premature for age. This is nonspecific, but most commonly seen in the setting of hypertension. There is no associated stenosis. 4. 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   Electronically Signed By: Marin Roberts M.D. On: 03/04/2016 09:56       ____________________________________________   PROCEDURES  Procedure(s) performed: None  Critical Care performed: No  ____________________________________________   INITIAL IMPRESSION / ASSESSMENT AND PLAN / ED COURSE  Pertinent labs & imaging results that were available during my care of the patient were reviewed by me and considered in my medical decision making (see chart for details).    Patient presents for evaluation of bifrontal throbbing headache. Associated with some mild infectious symptoms including chills, myalgias, and this headache has been going on for approximately 4 days now, not associated with any meningismus or pain. He does have a low-grade fever however. In addition  the patient's past medical history includes Previous stroke and carotid dissection presenting with headache. In addition he has a history of neurocysticercosis. The patient's previous medical history makes evaluation of his headache more complex. He does not have any obvious strokelike symptoms, and his neuro exam to me appears quite normal. Oh proceed with CT angios of the head and neck further evaluate for etiologies of headache including reevaluation for dissection, stroke, and progression of possible neurocysticercosis-type lesions.   ----------------------------------------- 11:06 AM on 03/04/2016 -----------------------------------------  CT angiography reassuring. Patient reports his headache is gone, he feels much better at this time. He is awake alert in no distress. He has no meningismus, good jolt accentuation is normal. I do not believe the patient is suffering from CSF  infection/meningitis, rather given his influenza A being positive and his symptoms I suspect he has influenza. He is outside the treatment window for initiating Tamiflu. He is awake alert in no distress, is able to take by mouth not hypoxic and in no distress at this time. I discussed with the patient and his family careful discharge and return precautions for which she is agreeable.  I again offered Spanish interpreter, though the patient appears to speak English and understand quite fluently. He reports he does not wish for interpreter. I think this is reasonable. ____________________________________________   FINAL CLINICAL IMPRESSION(S) / ED DIAGNOSES  Final diagnoses:  Influenza A      Sharyn Creamer, MD 03/04/16 [phone]

## 2016-03-04 NOTE — Discharge Instructions (Signed)
You were diagnosed with the flu (influenza).  You will feel ill for as much as a few weeks.  Please take any prescribed medications as instructed, and you may use over-the-counter Tylenol and/or ibuprofen as needed according to label instructions (unless you have an allergy to either or have been told by your doctor not to take them).  Follow up with your physician as instructed above, and return to the Emergency Department (ED) if you are unable to tolerate fluids due to vomiting, have worsening trouble breathing, become extremely tired or difficult to awaken, or if you develop any other symptoms that concern you.   Gripe - Adultos (Influenza, Adult) La gripe es una infeccin viral del tracto respiratorio. Ocurre con ms frecuencia en los meses de invierno, ya que las personas pasan ms tiempo en contacto cercano. La gripe puede enfermarlo considerablemente. Se transmite fcilmente de [place] persona a otra (es contagiosa). CAUSAS  La causa es un virus que infecta el tracto respiratorio. Puede contagiarse el virus al aspirar las gotitas que una persona infectada elimina al toser o Engineering geologistestornudar. Tambin puede contagiarse al tocar algo que fue recientemente contaminado con el virus y Tenet Healthcareluego llevarse la mano a la boca, la nariz o los ojos. RIESGOS Y COMPLICACIONES Tendr mayor riesgo de sufrir un resfro grave si consume cigarrillos, es diabtico, sufre una enfermedad cardaca (como insuficiencia cardaca) o pulmonar crnica (como asma) o si tiene debilitado el sistema inmunolgico. Los ancianos y las mujeres embarazadas tienen ms riesgo de sufrir infecciones graves. El problema ms frecuente de la gripe es la infeccin pulmonar (neumona). En algunos casos, este problema puede requerir atencin mdica de emergencia y Biochemist, clinicalponer en peligro la vida. Marnee SpringSIGNOS Y SNTOMAS  Los sntomas pueden durar entre 4 y [address] das y pueden ser:  Grant RutsFiebre.  Escalofros.  Dolor de Turkmenistancabeza, dolores en el cuerpo y musculares.  Dolor de  Advertising copywritergarganta.  Molestias en el pecho y tos.  Prdida del apetito.  Debilidad o cansancio.  Mareos.  Nuseas o vmitos. DIAGNSTICO  El diagnstico se realiza segn su historia clnica y un examen fsico. Es necesario realizar un anlisis de cultivo farngeo o nasal para confirmar el diagnstico. TRATAMIENTO  En los casos leves, la gripe se cura sin TEFL teachertratamiento. El tratamiento est dirigido a Consulting civil engineeraliviar los sntomas. En los casos ms graves, el mdico podr recetar medicamentos antivirales para acortar el curso de la enfermedad. Los antibiticos no son eficaces, ya que la infeccin est causada por un virus y no una bacteria. INSTRUCCIONES PARA EL CUIDADO EN EL HOGAR  Tome los medicamentos solamente como se lo haya indicado el mdico.  Utilice un humidificador de niebla fra para facilitar la respiracin.  Haga reposo hasta que la temperatura vuelva a ser normal. Generalmente esto lleva entre 3 y [address] das.  Beba suficiente lquido para Photographermantener la orina clara o de color amarillo plido.  Cbrase la boca y la nariz al toser o Engineering geologistestornudar, y Allstatelvese las manos muy bien para evitar que se propague el virus.  Lanny HurstQudese en su casa y no concurra al Aleen Campitrabajo o a la escuela hasta que la fiebre haya desaparecido al menos por un da completo. PREVENCIN  La vacunacin anual contra la gripe es la mejor manera de evitar enfermarse. Se recomienda ahora de manera rutinaria una vacuna anual contra la gripe a todos los Lowe's Companiesadultos estadounidenses. SOLICITE ATENCIN MDICA SI:  Tiene dolor en el pecho, la tos empeora o tiene ms mucosidad.  Tiene nuseas, vmitos o diarrea.  La Grant Rutsfiebre regresa  o empeora. SOLICITE ATENCIN MDICA DE INMEDIATO SI:   Tiene dificultad para respirar, le falta el aire o tiene la piel o las uas [place].  Presenta dolor intenso o entumecimiento en el cuello.  Le duele la cabeza de forma repentina o tiene dolor en la cara o el odo.  Tiene nuseas o vmitos que no puede  controlar. ASEGRESE DE QUE:   Comprende estas instrucciones.  Controlar su afeccin.  Recibir ayuda de inmediato si no mejora o si empeora.   Esta informacin no tiene Theme park manager el consejo del mdico. Asegrese de hacerle al mdico cualquier pregunta que tenga.   Document Released: 08/17/2005 Document Revised: 11/28/2014 Elsevier Interactive Patient Education [employer]

## 2016-03-04 NOTE — ED Notes (Addendum)
Pt reports feeling like he had flu symptoms on Wednesday, reports bilateral headache and pain to top of head. Pt reports no relief with headache. Pt reports headache in past, takes aspirin daily.

## 2017-09-15 ENCOUNTER — Other Ambulatory Visit: Payer: Self-pay | Admitting: Obstetrics and Gynecology

## 2018-08-21 ENCOUNTER — Emergency Department
Admission: EM | Admit: 2018-08-21 | Discharge: 2018-08-21 | Disposition: A | Discharge status: Home / Self Care | Payer: Self-pay | Attending: Emergency Medicine | Admitting: Emergency Medicine

## 2018-08-21 ENCOUNTER — Encounter: Payer: Self-pay | Admitting: Emergency Medicine

## 2018-08-21 ENCOUNTER — Emergency Department: Payer: Self-pay

## 2018-08-21 DIAGNOSIS — Y9389 Activity, other specified: Secondary | ICD-10-CM | POA: Insufficient documentation

## 2018-08-21 DIAGNOSIS — Y990 Civilian activity done for income or pay: Secondary | ICD-10-CM | POA: Insufficient documentation

## 2018-08-21 DIAGNOSIS — Z7982 Long term (current) use of aspirin: Secondary | ICD-10-CM | POA: Insufficient documentation

## 2018-08-21 DIAGNOSIS — S29012A Strain of muscle and tendon of back wall of thorax, initial encounter: Secondary | ICD-10-CM | POA: Insufficient documentation

## 2018-08-21 DIAGNOSIS — Y929 Unspecified place or not applicable: Secondary | ICD-10-CM | POA: Insufficient documentation

## 2018-08-21 DIAGNOSIS — S39012A Strain of muscle, fascia and tendon of lower back, initial encounter: Secondary | ICD-10-CM | POA: Insufficient documentation

## 2018-08-21 DIAGNOSIS — Z79899 Other long term (current) drug therapy: Secondary | ICD-10-CM | POA: Insufficient documentation

## 2018-08-21 MED ORDER — METHOCARBAMOL 500 MG PO TABS: 500.0000 mg | ORAL_TABLET | Freq: Four times a day (QID) | ORAL | 0 refills | Status: AC

## 2018-08-21 MED ORDER — MELOXICAM 15 MG PO TABS: 15.0000 mg | ORAL_TABLET | Freq: Every day | ORAL | 0 refills | Status: AC
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
## 2018-08-21 NOTE — ED Notes (Signed)
Spoke with MD Cyril Loosen and received verbal order for XRAY and for patient to be seen in [facility].
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
## 2024-01-17 ENCOUNTER — Ambulatory Visit: Payer: Self-pay
